# Patient Record
Sex: Female | Born: 1975 | Race: Black or African American | Hispanic: No | Marital: Married | Smoking: Never smoker
Health system: Southern US, Community
[De-identification: ages and names within clinical notes are randomized; demographics above are authoritative.]

## PROBLEM LIST (undated history)

## (undated) ENCOUNTER — Inpatient Hospital Stay (HOSPITAL_COMMUNITY): Payer: Self-pay

## (undated) DIAGNOSIS — R51 Headache: Secondary | ICD-10-CM

## (undated) DIAGNOSIS — O09519 Supervision of elderly primigravida, unspecified trimester: Secondary | ICD-10-CM

## (undated) DIAGNOSIS — Z8619 Personal history of other infectious and parasitic diseases: Secondary | ICD-10-CM

## (undated) DIAGNOSIS — F419 Anxiety disorder, unspecified: Secondary | ICD-10-CM

## (undated) DIAGNOSIS — R5383 Other fatigue: Secondary | ICD-10-CM

## (undated) DIAGNOSIS — F329 Major depressive disorder, single episode, unspecified: Secondary | ICD-10-CM

## (undated) DIAGNOSIS — F32A Depression, unspecified: Secondary | ICD-10-CM

## (undated) HISTORY — PX: LAPAROSCOPY: SHX197

## (undated) HISTORY — DX: Personal history of other infectious and parasitic diseases: Z86.19

## (undated) HISTORY — DX: Depression, unspecified: F32.A

## (undated) HISTORY — DX: Anxiety disorder, unspecified: F41.9

## (undated) HISTORY — DX: Other fatigue: R53.83

## (undated) HISTORY — DX: Headache: R51

## (undated) HISTORY — DX: Supervision of elderly primigravida, unspecified trimester: O09.519

## (undated) HISTORY — DX: Major depressive disorder, single episode, unspecified: F32.9

---

## 2005-12-12 ENCOUNTER — Other Ambulatory Visit: Admission: RE | Admit: 2005-12-12 | Discharge: 2005-12-12 | Payer: Self-pay | Admitting: Gynecology

## 2006-06-01 ENCOUNTER — Ambulatory Visit (HOSPITAL_COMMUNITY): Admission: RE | Admit: 2006-06-01 | Discharge: 2006-06-01 | Payer: Self-pay | Admitting: Neurology

## 2006-06-06 ENCOUNTER — Encounter: Admission: RE | Admit: 2006-06-06 | Discharge: 2006-06-06 | Payer: Self-pay | Admitting: Neurology

## 2006-11-01 ENCOUNTER — Other Ambulatory Visit: Admission: RE | Admit: 2006-11-01 | Discharge: 2006-11-01 | Payer: Self-pay | Admitting: Gynecology

## 2007-04-23 ENCOUNTER — Ambulatory Visit (HOSPITAL_COMMUNITY): Payer: Self-pay | Admitting: Psychiatry

## 2007-05-01 ENCOUNTER — Ambulatory Visit (HOSPITAL_COMMUNITY): Payer: Self-pay | Admitting: Licensed Clinical Social Worker

## 2007-05-08 ENCOUNTER — Ambulatory Visit (HOSPITAL_COMMUNITY): Payer: Self-pay | Admitting: Licensed Clinical Social Worker

## 2007-05-14 ENCOUNTER — Ambulatory Visit (HOSPITAL_COMMUNITY): Payer: Self-pay | Admitting: Licensed Clinical Social Worker

## 2007-05-23 ENCOUNTER — Ambulatory Visit (HOSPITAL_COMMUNITY): Payer: Self-pay | Admitting: Psychiatry

## 2007-05-23 ENCOUNTER — Ambulatory Visit (HOSPITAL_COMMUNITY): Payer: Self-pay | Admitting: Licensed Clinical Social Worker

## 2007-05-30 ENCOUNTER — Ambulatory Visit (HOSPITAL_COMMUNITY): Payer: Self-pay | Admitting: Licensed Clinical Social Worker

## 2007-06-07 ENCOUNTER — Ambulatory Visit (HOSPITAL_COMMUNITY): Payer: Self-pay | Admitting: Licensed Clinical Social Worker

## 2007-06-18 ENCOUNTER — Ambulatory Visit (HOSPITAL_COMMUNITY): Payer: Self-pay | Admitting: Licensed Clinical Social Worker

## 2007-06-22 ENCOUNTER — Ambulatory Visit (HOSPITAL_COMMUNITY): Payer: Self-pay | Admitting: Psychiatry

## 2007-07-12 ENCOUNTER — Ambulatory Visit (HOSPITAL_COMMUNITY): Payer: Self-pay | Admitting: Licensed Clinical Social Worker

## 2007-07-19 ENCOUNTER — Ambulatory Visit (HOSPITAL_COMMUNITY): Payer: Self-pay | Admitting: Licensed Clinical Social Worker

## 2007-07-25 ENCOUNTER — Ambulatory Visit (HOSPITAL_COMMUNITY): Payer: Self-pay | Admitting: Licensed Clinical Social Worker

## 2007-07-27 ENCOUNTER — Ambulatory Visit (HOSPITAL_COMMUNITY): Payer: Self-pay | Admitting: Psychiatry

## 2007-08-01 ENCOUNTER — Ambulatory Visit (HOSPITAL_COMMUNITY): Payer: Self-pay | Admitting: Licensed Clinical Social Worker

## 2007-08-08 ENCOUNTER — Ambulatory Visit (HOSPITAL_COMMUNITY): Payer: Self-pay | Admitting: Licensed Clinical Social Worker

## 2007-08-13 ENCOUNTER — Ambulatory Visit (HOSPITAL_COMMUNITY): Payer: Self-pay | Admitting: Licensed Clinical Social Worker

## 2007-08-22 ENCOUNTER — Ambulatory Visit (HOSPITAL_COMMUNITY): Payer: Self-pay | Admitting: Licensed Clinical Social Worker

## 2007-08-24 ENCOUNTER — Ambulatory Visit (HOSPITAL_COMMUNITY): Payer: Self-pay | Admitting: Psychiatry

## 2007-09-05 ENCOUNTER — Ambulatory Visit (HOSPITAL_COMMUNITY): Payer: Self-pay | Admitting: Licensed Clinical Social Worker

## 2007-09-12 ENCOUNTER — Ambulatory Visit (HOSPITAL_COMMUNITY): Payer: Self-pay | Admitting: Licensed Clinical Social Worker

## 2007-09-14 ENCOUNTER — Ambulatory Visit (HOSPITAL_COMMUNITY): Payer: Self-pay | Admitting: Psychiatry

## 2007-09-19 ENCOUNTER — Ambulatory Visit (HOSPITAL_COMMUNITY): Payer: Self-pay | Admitting: Licensed Clinical Social Worker

## 2007-09-25 IMAGING — RF DG FLUORO GUIDE NDL PLC/BX
1 series · 1 of 1 positions shown · non-contrast
Comparison: none

CLINICAL DATA: Abnormal MRI, headaches, rule out MS.  
 LUMBAR PUNCTURE ? USING FLUOROSCOPY:

[Series 1: run · 1 of 1 slices shown]
[im 1/1]
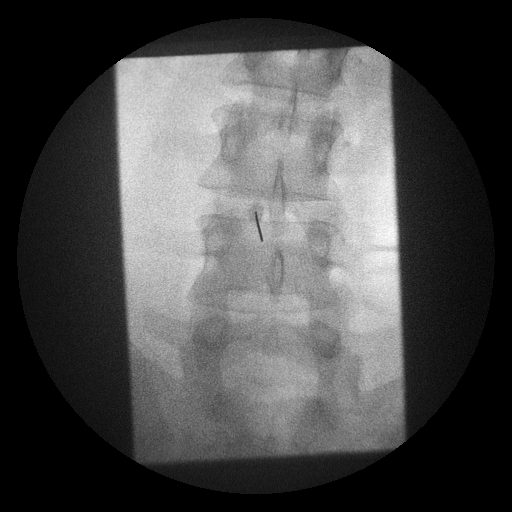

[1 of 1 positions shown; findings below may reference images not displayed]

FINDINGS: After obtaining informed written consent, the lower back was prepped with Betadine.  Then 2% lidocaine was used for local anesthesia.  Lumbar puncture was performed using fluoroscopic guidance at the L3-4 level on the left.  Opening pressure was 26 cm water.  CSF was clear.  Then 9 cc were obtained in four separate vials for laboratory studies.
IMPRESSION: Successful lumbar puncture.

## 2007-09-26 ENCOUNTER — Ambulatory Visit (HOSPITAL_COMMUNITY): Payer: Self-pay | Admitting: Licensed Clinical Social Worker

## 2007-09-26 ENCOUNTER — Ambulatory Visit (HOSPITAL_COMMUNITY): Payer: Self-pay | Admitting: Psychiatry

## 2007-10-03 ENCOUNTER — Ambulatory Visit (HOSPITAL_COMMUNITY): Payer: Self-pay | Admitting: Licensed Clinical Social Worker

## 2007-10-10 ENCOUNTER — Ambulatory Visit (HOSPITAL_COMMUNITY): Payer: Self-pay | Admitting: Licensed Clinical Social Worker

## 2007-10-18 ENCOUNTER — Ambulatory Visit (HOSPITAL_COMMUNITY): Payer: Self-pay | Admitting: Licensed Clinical Social Worker

## 2007-10-22 ENCOUNTER — Ambulatory Visit (HOSPITAL_COMMUNITY): Payer: Self-pay | Admitting: Psychiatry

## 2007-10-24 ENCOUNTER — Ambulatory Visit (HOSPITAL_COMMUNITY): Payer: Self-pay | Admitting: Licensed Clinical Social Worker

## 2007-10-31 ENCOUNTER — Ambulatory Visit (HOSPITAL_COMMUNITY): Payer: Self-pay | Admitting: Licensed Clinical Social Worker

## 2007-11-07 ENCOUNTER — Ambulatory Visit (HOSPITAL_COMMUNITY): Payer: Self-pay | Admitting: Licensed Clinical Social Worker

## 2007-11-12 ENCOUNTER — Other Ambulatory Visit: Admission: RE | Admit: 2007-11-12 | Discharge: 2007-11-12 | Payer: Self-pay | Admitting: Gynecology

## 2007-11-14 ENCOUNTER — Ambulatory Visit (HOSPITAL_COMMUNITY): Payer: Self-pay | Admitting: Licensed Clinical Social Worker

## 2007-11-16 ENCOUNTER — Ambulatory Visit (HOSPITAL_COMMUNITY): Payer: Self-pay | Admitting: Psychiatry

## 2007-11-21 ENCOUNTER — Ambulatory Visit (HOSPITAL_COMMUNITY): Payer: Self-pay | Admitting: Licensed Clinical Social Worker

## 2007-11-28 ENCOUNTER — Ambulatory Visit (HOSPITAL_COMMUNITY): Payer: Self-pay | Admitting: Licensed Clinical Social Worker

## 2007-12-05 ENCOUNTER — Ambulatory Visit (HOSPITAL_COMMUNITY): Payer: Self-pay | Admitting: Licensed Clinical Social Worker

## 2007-12-12 ENCOUNTER — Ambulatory Visit (HOSPITAL_COMMUNITY): Payer: Self-pay | Admitting: Licensed Clinical Social Worker

## 2008-01-04 ENCOUNTER — Ambulatory Visit (HOSPITAL_COMMUNITY): Payer: Self-pay | Admitting: Licensed Clinical Social Worker

## 2008-01-10 ENCOUNTER — Ambulatory Visit (HOSPITAL_COMMUNITY): Payer: Self-pay | Admitting: Licensed Clinical Social Worker

## 2008-01-16 ENCOUNTER — Ambulatory Visit (HOSPITAL_COMMUNITY): Payer: Self-pay | Admitting: Licensed Clinical Social Worker

## 2008-01-23 ENCOUNTER — Ambulatory Visit (HOSPITAL_COMMUNITY): Payer: Self-pay | Admitting: Licensed Clinical Social Worker

## 2008-01-30 ENCOUNTER — Ambulatory Visit (HOSPITAL_COMMUNITY): Payer: Self-pay | Admitting: Licensed Clinical Social Worker

## 2008-02-06 ENCOUNTER — Ambulatory Visit (HOSPITAL_COMMUNITY): Payer: Self-pay | Admitting: Licensed Clinical Social Worker

## 2008-02-14 ENCOUNTER — Ambulatory Visit (HOSPITAL_COMMUNITY): Payer: Self-pay | Admitting: Licensed Clinical Social Worker

## 2008-02-20 ENCOUNTER — Ambulatory Visit (HOSPITAL_COMMUNITY): Payer: Self-pay | Admitting: Licensed Clinical Social Worker

## 2008-02-27 ENCOUNTER — Ambulatory Visit (HOSPITAL_COMMUNITY): Payer: Self-pay | Admitting: Licensed Clinical Social Worker

## 2008-03-05 ENCOUNTER — Ambulatory Visit (HOSPITAL_COMMUNITY): Payer: Self-pay | Admitting: Licensed Clinical Social Worker

## 2008-03-12 ENCOUNTER — Ambulatory Visit (HOSPITAL_COMMUNITY): Payer: Self-pay | Admitting: Licensed Clinical Social Worker

## 2008-03-17 ENCOUNTER — Ambulatory Visit (HOSPITAL_COMMUNITY): Payer: Self-pay | Admitting: Psychiatry

## 2008-03-19 ENCOUNTER — Ambulatory Visit (HOSPITAL_COMMUNITY): Payer: Self-pay | Admitting: Licensed Clinical Social Worker

## 2008-03-26 ENCOUNTER — Ambulatory Visit (HOSPITAL_COMMUNITY): Payer: Self-pay | Admitting: Licensed Clinical Social Worker

## 2008-04-03 ENCOUNTER — Ambulatory Visit (HOSPITAL_COMMUNITY): Payer: Self-pay | Admitting: Licensed Clinical Social Worker

## 2008-04-09 ENCOUNTER — Ambulatory Visit (HOSPITAL_COMMUNITY): Payer: Self-pay | Admitting: Licensed Clinical Social Worker

## 2008-04-16 ENCOUNTER — Ambulatory Visit (HOSPITAL_COMMUNITY): Payer: Self-pay | Admitting: Licensed Clinical Social Worker

## 2008-04-23 ENCOUNTER — Ambulatory Visit (HOSPITAL_COMMUNITY): Payer: Self-pay | Admitting: Licensed Clinical Social Worker

## 2008-05-08 ENCOUNTER — Ambulatory Visit (HOSPITAL_COMMUNITY): Payer: Self-pay | Admitting: Licensed Clinical Social Worker

## 2008-05-12 ENCOUNTER — Ambulatory Visit (HOSPITAL_COMMUNITY): Payer: Self-pay | Admitting: Psychiatry

## 2008-05-14 ENCOUNTER — Ambulatory Visit (HOSPITAL_COMMUNITY): Payer: Self-pay | Admitting: Licensed Clinical Social Worker

## 2008-05-21 ENCOUNTER — Ambulatory Visit (HOSPITAL_COMMUNITY): Payer: Self-pay | Admitting: Licensed Clinical Social Worker

## 2008-05-28 ENCOUNTER — Ambulatory Visit (HOSPITAL_COMMUNITY): Payer: Self-pay | Admitting: Licensed Clinical Social Worker

## 2008-06-04 ENCOUNTER — Ambulatory Visit (HOSPITAL_COMMUNITY): Payer: Self-pay | Admitting: Licensed Clinical Social Worker

## 2008-06-11 ENCOUNTER — Ambulatory Visit (HOSPITAL_COMMUNITY): Payer: Self-pay | Admitting: Licensed Clinical Social Worker

## 2008-06-11 ENCOUNTER — Ambulatory Visit (HOSPITAL_COMMUNITY): Payer: Self-pay | Admitting: Psychiatry

## 2008-06-25 ENCOUNTER — Ambulatory Visit (HOSPITAL_COMMUNITY): Payer: Self-pay | Admitting: Licensed Clinical Social Worker

## 2008-06-25 ENCOUNTER — Ambulatory Visit (HOSPITAL_COMMUNITY): Payer: Self-pay | Admitting: Psychiatry

## 2008-07-11 ENCOUNTER — Ambulatory Visit (HOSPITAL_COMMUNITY): Payer: Self-pay | Admitting: Psychiatry

## 2008-07-16 ENCOUNTER — Ambulatory Visit (HOSPITAL_COMMUNITY): Payer: Self-pay | Admitting: Licensed Clinical Social Worker

## 2008-07-23 ENCOUNTER — Ambulatory Visit (HOSPITAL_COMMUNITY): Payer: Self-pay | Admitting: Licensed Clinical Social Worker

## 2008-07-30 ENCOUNTER — Ambulatory Visit (HOSPITAL_COMMUNITY): Payer: Self-pay | Admitting: Licensed Clinical Social Worker

## 2008-08-06 ENCOUNTER — Ambulatory Visit (HOSPITAL_COMMUNITY): Payer: Self-pay | Admitting: Psychiatry

## 2008-08-06 ENCOUNTER — Ambulatory Visit (HOSPITAL_COMMUNITY): Payer: Self-pay | Admitting: Licensed Clinical Social Worker

## 2008-08-13 ENCOUNTER — Ambulatory Visit (HOSPITAL_COMMUNITY): Payer: Self-pay | Admitting: Licensed Clinical Social Worker

## 2008-08-20 ENCOUNTER — Ambulatory Visit (HOSPITAL_COMMUNITY): Payer: Self-pay | Admitting: Licensed Clinical Social Worker

## 2008-08-27 ENCOUNTER — Ambulatory Visit (HOSPITAL_COMMUNITY): Payer: Self-pay | Admitting: Psychiatry

## 2008-08-27 ENCOUNTER — Ambulatory Visit (HOSPITAL_COMMUNITY): Payer: Self-pay | Admitting: Licensed Clinical Social Worker

## 2008-09-03 ENCOUNTER — Ambulatory Visit (HOSPITAL_COMMUNITY): Payer: Self-pay | Admitting: Licensed Clinical Social Worker

## 2008-09-10 ENCOUNTER — Ambulatory Visit (HOSPITAL_COMMUNITY): Payer: Self-pay | Admitting: Licensed Clinical Social Worker

## 2008-09-17 ENCOUNTER — Ambulatory Visit (HOSPITAL_COMMUNITY): Payer: Self-pay | Admitting: Licensed Clinical Social Worker

## 2008-09-24 ENCOUNTER — Ambulatory Visit (HOSPITAL_COMMUNITY): Payer: Self-pay | Admitting: Licensed Clinical Social Worker

## 2008-09-24 ENCOUNTER — Ambulatory Visit (HOSPITAL_COMMUNITY): Payer: Self-pay | Admitting: Psychiatry

## 2008-10-01 ENCOUNTER — Ambulatory Visit (HOSPITAL_COMMUNITY): Payer: Self-pay | Admitting: Licensed Clinical Social Worker

## 2008-10-08 ENCOUNTER — Ambulatory Visit (HOSPITAL_COMMUNITY): Payer: Self-pay | Admitting: Licensed Clinical Social Worker

## 2008-10-15 ENCOUNTER — Ambulatory Visit (HOSPITAL_COMMUNITY): Payer: Self-pay | Admitting: Licensed Clinical Social Worker

## 2008-10-22 ENCOUNTER — Ambulatory Visit (HOSPITAL_COMMUNITY): Payer: Self-pay | Admitting: Licensed Clinical Social Worker

## 2008-10-29 ENCOUNTER — Ambulatory Visit (HOSPITAL_COMMUNITY): Payer: Self-pay | Admitting: Licensed Clinical Social Worker

## 2008-11-04 ENCOUNTER — Other Ambulatory Visit: Admission: RE | Admit: 2008-11-04 | Discharge: 2008-11-04 | Payer: Self-pay | Admitting: Gynecology

## 2008-11-05 ENCOUNTER — Ambulatory Visit (HOSPITAL_COMMUNITY): Payer: Self-pay | Admitting: Licensed Clinical Social Worker

## 2008-11-12 ENCOUNTER — Ambulatory Visit (HOSPITAL_COMMUNITY): Payer: Self-pay | Admitting: Licensed Clinical Social Worker

## 2008-11-12 ENCOUNTER — Ambulatory Visit (HOSPITAL_COMMUNITY): Payer: Self-pay | Admitting: Psychiatry

## 2008-11-19 ENCOUNTER — Ambulatory Visit (HOSPITAL_COMMUNITY): Payer: Self-pay | Admitting: Licensed Clinical Social Worker

## 2008-11-26 ENCOUNTER — Ambulatory Visit (HOSPITAL_COMMUNITY): Payer: Self-pay | Admitting: Licensed Clinical Social Worker

## 2008-12-03 ENCOUNTER — Ambulatory Visit (HOSPITAL_COMMUNITY): Payer: Self-pay | Admitting: Psychiatry

## 2008-12-10 ENCOUNTER — Ambulatory Visit (HOSPITAL_COMMUNITY): Payer: Self-pay | Admitting: Licensed Clinical Social Worker

## 2008-12-17 ENCOUNTER — Ambulatory Visit (HOSPITAL_COMMUNITY): Payer: Self-pay | Admitting: Licensed Clinical Social Worker

## 2008-12-24 ENCOUNTER — Ambulatory Visit (HOSPITAL_COMMUNITY): Payer: Self-pay | Admitting: Licensed Clinical Social Worker

## 2008-12-31 ENCOUNTER — Ambulatory Visit (HOSPITAL_COMMUNITY): Payer: Self-pay | Admitting: Licensed Clinical Social Worker

## 2008-12-31 ENCOUNTER — Ambulatory Visit (HOSPITAL_COMMUNITY): Payer: Self-pay | Admitting: Psychiatry

## 2009-01-07 ENCOUNTER — Ambulatory Visit (HOSPITAL_COMMUNITY): Payer: Self-pay | Admitting: Licensed Clinical Social Worker

## 2009-01-14 ENCOUNTER — Ambulatory Visit (HOSPITAL_COMMUNITY): Payer: Self-pay | Admitting: Licensed Clinical Social Worker

## 2009-01-19 ENCOUNTER — Ambulatory Visit (HOSPITAL_COMMUNITY): Payer: Self-pay | Admitting: Licensed Clinical Social Worker

## 2009-01-28 ENCOUNTER — Ambulatory Visit (HOSPITAL_COMMUNITY): Payer: Self-pay | Admitting: Licensed Clinical Social Worker

## 2009-02-02 ENCOUNTER — Ambulatory Visit (HOSPITAL_COMMUNITY): Payer: Self-pay | Admitting: Licensed Clinical Social Worker

## 2009-02-09 ENCOUNTER — Ambulatory Visit (HOSPITAL_COMMUNITY): Payer: Self-pay | Admitting: Licensed Clinical Social Worker

## 2009-02-25 ENCOUNTER — Ambulatory Visit (HOSPITAL_COMMUNITY): Payer: Self-pay | Admitting: Licensed Clinical Social Worker

## 2009-03-02 ENCOUNTER — Ambulatory Visit (HOSPITAL_COMMUNITY): Payer: Self-pay | Admitting: Licensed Clinical Social Worker

## 2009-03-09 ENCOUNTER — Ambulatory Visit (HOSPITAL_COMMUNITY): Payer: Self-pay | Admitting: Licensed Clinical Social Worker

## 2009-03-16 ENCOUNTER — Ambulatory Visit (HOSPITAL_COMMUNITY): Payer: Self-pay | Admitting: Licensed Clinical Social Worker

## 2009-03-23 ENCOUNTER — Ambulatory Visit (HOSPITAL_COMMUNITY): Payer: Self-pay | Admitting: Licensed Clinical Social Worker

## 2009-03-23 ENCOUNTER — Ambulatory Visit (HOSPITAL_COMMUNITY): Payer: Self-pay | Admitting: Psychiatry

## 2009-04-06 ENCOUNTER — Ambulatory Visit (HOSPITAL_COMMUNITY): Payer: Self-pay | Admitting: Licensed Clinical Social Worker

## 2009-04-13 ENCOUNTER — Ambulatory Visit (HOSPITAL_COMMUNITY): Payer: Self-pay | Admitting: Licensed Clinical Social Worker

## 2009-04-20 ENCOUNTER — Ambulatory Visit (HOSPITAL_COMMUNITY): Payer: Self-pay | Admitting: Licensed Clinical Social Worker

## 2009-05-04 ENCOUNTER — Ambulatory Visit (HOSPITAL_COMMUNITY): Payer: Self-pay | Admitting: Licensed Clinical Social Worker

## 2009-05-11 ENCOUNTER — Ambulatory Visit (HOSPITAL_COMMUNITY): Payer: Self-pay | Admitting: Licensed Clinical Social Worker

## 2009-05-18 ENCOUNTER — Ambulatory Visit (HOSPITAL_COMMUNITY): Payer: Self-pay | Admitting: Psychiatry

## 2009-05-25 ENCOUNTER — Ambulatory Visit (HOSPITAL_COMMUNITY): Payer: Self-pay | Admitting: Licensed Clinical Social Worker

## 2009-06-08 ENCOUNTER — Ambulatory Visit (HOSPITAL_COMMUNITY): Payer: Self-pay | Admitting: Licensed Clinical Social Worker

## 2009-06-15 ENCOUNTER — Ambulatory Visit (HOSPITAL_COMMUNITY): Payer: Self-pay | Admitting: Licensed Clinical Social Worker

## 2009-06-22 ENCOUNTER — Ambulatory Visit (HOSPITAL_COMMUNITY): Payer: Self-pay | Admitting: Licensed Clinical Social Worker

## 2009-06-29 ENCOUNTER — Ambulatory Visit (HOSPITAL_COMMUNITY): Payer: Self-pay | Admitting: Licensed Clinical Social Worker

## 2009-07-13 ENCOUNTER — Ambulatory Visit (HOSPITAL_COMMUNITY): Payer: Self-pay | Admitting: Licensed Clinical Social Worker

## 2009-07-20 ENCOUNTER — Ambulatory Visit (HOSPITAL_COMMUNITY): Payer: Self-pay | Admitting: Psychiatry

## 2009-07-20 ENCOUNTER — Ambulatory Visit (HOSPITAL_COMMUNITY): Payer: Self-pay | Admitting: Licensed Clinical Social Worker

## 2009-07-27 ENCOUNTER — Ambulatory Visit (HOSPITAL_COMMUNITY): Payer: Self-pay | Admitting: Licensed Clinical Social Worker

## 2009-08-03 ENCOUNTER — Ambulatory Visit (HOSPITAL_COMMUNITY): Payer: Self-pay | Admitting: Licensed Clinical Social Worker

## 2009-08-10 ENCOUNTER — Ambulatory Visit (HOSPITAL_COMMUNITY): Payer: Self-pay | Admitting: Licensed Clinical Social Worker

## 2009-08-17 ENCOUNTER — Ambulatory Visit (HOSPITAL_COMMUNITY): Payer: Self-pay | Admitting: Licensed Clinical Social Worker

## 2009-08-26 ENCOUNTER — Ambulatory Visit (HOSPITAL_COMMUNITY): Payer: Self-pay | Admitting: Licensed Clinical Social Worker

## 2009-09-08 ENCOUNTER — Ambulatory Visit (HOSPITAL_COMMUNITY): Payer: Self-pay | Admitting: Licensed Clinical Social Worker

## 2009-09-14 ENCOUNTER — Ambulatory Visit (HOSPITAL_COMMUNITY): Payer: Self-pay | Admitting: Licensed Clinical Social Worker

## 2009-09-21 ENCOUNTER — Ambulatory Visit (HOSPITAL_COMMUNITY): Payer: Self-pay | Admitting: Licensed Clinical Social Worker

## 2009-09-29 ENCOUNTER — Ambulatory Visit (HOSPITAL_COMMUNITY): Payer: Self-pay | Admitting: Licensed Clinical Social Worker

## 2009-10-05 ENCOUNTER — Ambulatory Visit (HOSPITAL_COMMUNITY): Payer: Self-pay | Admitting: Licensed Clinical Social Worker

## 2009-10-12 ENCOUNTER — Ambulatory Visit (HOSPITAL_COMMUNITY): Payer: Self-pay | Admitting: Licensed Clinical Social Worker

## 2009-10-19 ENCOUNTER — Ambulatory Visit (HOSPITAL_COMMUNITY): Payer: Self-pay | Admitting: Psychiatry

## 2009-10-19 ENCOUNTER — Ambulatory Visit (HOSPITAL_COMMUNITY): Payer: Self-pay | Admitting: Licensed Clinical Social Worker

## 2009-11-02 ENCOUNTER — Ambulatory Visit (HOSPITAL_COMMUNITY): Payer: Self-pay | Admitting: Licensed Clinical Social Worker

## 2009-11-09 ENCOUNTER — Ambulatory Visit (HOSPITAL_COMMUNITY): Payer: Self-pay | Admitting: Licensed Clinical Social Worker

## 2009-11-18 ENCOUNTER — Ambulatory Visit (HOSPITAL_COMMUNITY): Payer: Self-pay | Admitting: Licensed Clinical Social Worker

## 2009-11-23 ENCOUNTER — Ambulatory Visit (HOSPITAL_COMMUNITY): Payer: Self-pay | Admitting: Licensed Clinical Social Worker

## 2009-11-30 ENCOUNTER — Ambulatory Visit (HOSPITAL_COMMUNITY): Payer: Self-pay | Admitting: Licensed Clinical Social Worker

## 2009-11-30 ENCOUNTER — Ambulatory Visit (HOSPITAL_COMMUNITY): Payer: Self-pay | Admitting: Psychiatry

## 2009-12-07 ENCOUNTER — Ambulatory Visit (HOSPITAL_COMMUNITY): Payer: Self-pay | Admitting: Licensed Clinical Social Worker

## 2009-12-21 ENCOUNTER — Ambulatory Visit (HOSPITAL_COMMUNITY): Payer: Self-pay | Admitting: Licensed Clinical Social Worker

## 2009-12-25 ENCOUNTER — Ambulatory Visit (HOSPITAL_COMMUNITY): Payer: Self-pay | Admitting: Psychiatry

## 2009-12-28 ENCOUNTER — Ambulatory Visit (HOSPITAL_COMMUNITY): Payer: Self-pay | Admitting: Licensed Clinical Social Worker

## 2010-01-04 ENCOUNTER — Ambulatory Visit (HOSPITAL_COMMUNITY): Payer: Self-pay | Admitting: Licensed Clinical Social Worker

## 2010-01-06 ENCOUNTER — Ambulatory Visit (HOSPITAL_COMMUNITY): Payer: Self-pay | Admitting: Psychiatry

## 2010-01-18 ENCOUNTER — Ambulatory Visit (HOSPITAL_COMMUNITY): Payer: Self-pay | Admitting: Licensed Clinical Social Worker

## 2010-01-25 ENCOUNTER — Ambulatory Visit (HOSPITAL_COMMUNITY): Payer: Self-pay | Admitting: Licensed Clinical Social Worker

## 2010-02-01 ENCOUNTER — Ambulatory Visit (HOSPITAL_COMMUNITY): Payer: Self-pay | Admitting: Licensed Clinical Social Worker

## 2010-02-03 ENCOUNTER — Ambulatory Visit (HOSPITAL_COMMUNITY): Payer: Self-pay | Admitting: Psychiatry

## 2010-02-08 ENCOUNTER — Ambulatory Visit (HOSPITAL_COMMUNITY): Payer: Self-pay | Admitting: Licensed Clinical Social Worker

## 2010-02-22 ENCOUNTER — Ambulatory Visit (HOSPITAL_COMMUNITY): Payer: Self-pay | Admitting: Licensed Clinical Social Worker

## 2010-03-10 ENCOUNTER — Ambulatory Visit (HOSPITAL_COMMUNITY): Payer: Self-pay | Admitting: Licensed Clinical Social Worker

## 2010-03-15 ENCOUNTER — Ambulatory Visit (HOSPITAL_COMMUNITY): Payer: Self-pay | Admitting: Licensed Clinical Social Worker

## 2010-03-22 ENCOUNTER — Ambulatory Visit (HOSPITAL_COMMUNITY): Payer: Self-pay | Admitting: Licensed Clinical Social Worker

## 2010-03-31 ENCOUNTER — Ambulatory Visit (HOSPITAL_COMMUNITY): Payer: Self-pay | Admitting: Licensed Clinical Social Worker

## 2010-04-05 ENCOUNTER — Ambulatory Visit (HOSPITAL_COMMUNITY): Payer: Self-pay | Admitting: Psychiatry

## 2010-04-05 ENCOUNTER — Ambulatory Visit (HOSPITAL_COMMUNITY): Payer: Self-pay | Admitting: Licensed Clinical Social Worker

## 2010-04-12 ENCOUNTER — Ambulatory Visit (HOSPITAL_COMMUNITY): Payer: Self-pay | Admitting: Licensed Clinical Social Worker

## 2010-04-19 ENCOUNTER — Ambulatory Visit (HOSPITAL_COMMUNITY): Payer: Self-pay | Admitting: Psychiatry

## 2010-04-19 ENCOUNTER — Ambulatory Visit (HOSPITAL_COMMUNITY): Payer: Self-pay | Admitting: Licensed Clinical Social Worker

## 2010-04-28 ENCOUNTER — Ambulatory Visit (HOSPITAL_COMMUNITY): Payer: Self-pay | Admitting: Psychiatry

## 2010-05-04 ENCOUNTER — Ambulatory Visit (HOSPITAL_COMMUNITY): Payer: Self-pay | Admitting: Licensed Clinical Social Worker

## 2010-05-11 ENCOUNTER — Ambulatory Visit (HOSPITAL_COMMUNITY): Payer: Self-pay | Admitting: Licensed Clinical Social Worker

## 2010-05-24 ENCOUNTER — Ambulatory Visit (HOSPITAL_COMMUNITY): Payer: Self-pay | Admitting: Licensed Clinical Social Worker

## 2010-05-24 ENCOUNTER — Ambulatory Visit (HOSPITAL_COMMUNITY): Payer: Self-pay | Admitting: Psychiatry

## 2010-05-31 ENCOUNTER — Ambulatory Visit (HOSPITAL_COMMUNITY): Payer: Self-pay | Admitting: Licensed Clinical Social Worker

## 2010-06-07 ENCOUNTER — Ambulatory Visit (HOSPITAL_COMMUNITY): Payer: Self-pay | Admitting: Licensed Clinical Social Worker

## 2010-06-21 ENCOUNTER — Ambulatory Visit (HOSPITAL_COMMUNITY): Payer: Self-pay | Admitting: Licensed Clinical Social Worker

## 2010-06-28 ENCOUNTER — Ambulatory Visit (HOSPITAL_COMMUNITY): Payer: Self-pay | Admitting: Psychiatry

## 2010-06-28 ENCOUNTER — Ambulatory Visit (HOSPITAL_COMMUNITY): Payer: Self-pay | Admitting: Licensed Clinical Social Worker

## 2010-07-06 ENCOUNTER — Ambulatory Visit (HOSPITAL_COMMUNITY): Payer: Self-pay | Admitting: Licensed Clinical Social Worker

## 2010-07-12 ENCOUNTER — Ambulatory Visit (HOSPITAL_COMMUNITY): Payer: Self-pay | Admitting: Licensed Clinical Social Worker

## 2010-07-14 ENCOUNTER — Ambulatory Visit (HOSPITAL_COMMUNITY): Payer: Self-pay | Admitting: Psychiatry

## 2010-07-19 ENCOUNTER — Ambulatory Visit (HOSPITAL_COMMUNITY): Payer: Self-pay | Admitting: Licensed Clinical Social Worker

## 2010-08-02 ENCOUNTER — Ambulatory Visit (HOSPITAL_COMMUNITY): Payer: Self-pay | Admitting: Licensed Clinical Social Worker

## 2010-08-04 ENCOUNTER — Ambulatory Visit (HOSPITAL_COMMUNITY): Payer: Self-pay | Admitting: Psychiatry

## 2010-08-09 ENCOUNTER — Ambulatory Visit (HOSPITAL_COMMUNITY): Payer: Self-pay | Admitting: Licensed Clinical Social Worker

## 2010-08-23 ENCOUNTER — Ambulatory Visit (HOSPITAL_COMMUNITY): Payer: Self-pay | Admitting: Licensed Clinical Social Worker

## 2010-08-30 ENCOUNTER — Ambulatory Visit (HOSPITAL_COMMUNITY): Payer: Self-pay | Admitting: Licensed Clinical Social Worker

## 2010-09-07 ENCOUNTER — Ambulatory Visit (HOSPITAL_COMMUNITY): Payer: Self-pay | Admitting: Licensed Clinical Social Worker

## 2010-09-13 ENCOUNTER — Ambulatory Visit (HOSPITAL_COMMUNITY): Payer: Self-pay | Admitting: Licensed Clinical Social Worker

## 2010-09-20 ENCOUNTER — Ambulatory Visit (HOSPITAL_COMMUNITY): Payer: Self-pay | Admitting: Licensed Clinical Social Worker

## 2010-10-04 ENCOUNTER — Ambulatory Visit (HOSPITAL_COMMUNITY): Payer: Self-pay | Admitting: Psychiatry

## 2010-10-05 ENCOUNTER — Ambulatory Visit (HOSPITAL_COMMUNITY): Payer: Self-pay | Admitting: Licensed Clinical Social Worker

## 2010-10-11 ENCOUNTER — Ambulatory Visit (HOSPITAL_COMMUNITY): Payer: Self-pay | Admitting: Licensed Clinical Social Worker

## 2010-10-18 ENCOUNTER — Ambulatory Visit (HOSPITAL_COMMUNITY): Payer: Self-pay | Admitting: Licensed Clinical Social Worker

## 2010-10-26 ENCOUNTER — Ambulatory Visit (HOSPITAL_COMMUNITY): Payer: Self-pay | Admitting: Licensed Clinical Social Worker

## 2010-11-02 ENCOUNTER — Ambulatory Visit (HOSPITAL_COMMUNITY)
Admission: RE | Admit: 2010-11-02 | Discharge: 2010-11-02 | Payer: Self-pay | Source: Home / Self Care | Attending: Licensed Clinical Social Worker | Admitting: Licensed Clinical Social Worker

## 2010-11-08 ENCOUNTER — Ambulatory Visit (HOSPITAL_COMMUNITY)
Admission: RE | Admit: 2010-11-08 | Discharge: 2010-11-08 | Payer: Self-pay | Source: Home / Self Care | Attending: Licensed Clinical Social Worker | Admitting: Licensed Clinical Social Worker

## 2010-11-12 ENCOUNTER — Ambulatory Visit (HOSPITAL_COMMUNITY)
Admission: RE | Admit: 2010-11-12 | Discharge: 2010-11-12 | Payer: Self-pay | Source: Home / Self Care | Attending: Psychiatry | Admitting: Psychiatry

## 2010-11-15 ENCOUNTER — Ambulatory Visit (HOSPITAL_COMMUNITY)
Admission: RE | Admit: 2010-11-15 | Discharge: 2010-11-15 | Payer: Self-pay | Source: Home / Self Care | Attending: Licensed Clinical Social Worker | Admitting: Licensed Clinical Social Worker

## 2010-11-22 ENCOUNTER — Ambulatory Visit (HOSPITAL_COMMUNITY)
Admission: RE | Admit: 2010-11-22 | Discharge: 2010-11-22 | Payer: Self-pay | Source: Home / Self Care | Attending: Licensed Clinical Social Worker | Admitting: Licensed Clinical Social Worker

## 2010-11-29 ENCOUNTER — Ambulatory Visit (HOSPITAL_COMMUNITY)
Admission: RE | Admit: 2010-11-29 | Discharge: 2010-11-29 | Payer: Self-pay | Source: Home / Self Care | Attending: Licensed Clinical Social Worker | Admitting: Licensed Clinical Social Worker

## 2010-12-06 ENCOUNTER — Encounter (HOSPITAL_COMMUNITY): Payer: 59 | Admitting: Licensed Clinical Social Worker

## 2010-12-06 DIAGNOSIS — F332 Major depressive disorder, recurrent severe without psychotic features: Secondary | ICD-10-CM

## 2010-12-13 ENCOUNTER — Encounter (HOSPITAL_COMMUNITY): Payer: 59 | Admitting: Licensed Clinical Social Worker

## 2010-12-13 DIAGNOSIS — F332 Major depressive disorder, recurrent severe without psychotic features: Secondary | ICD-10-CM

## 2010-12-20 ENCOUNTER — Encounter (HOSPITAL_COMMUNITY): Payer: Self-pay | Admitting: Licensed Clinical Social Worker

## 2010-12-22 ENCOUNTER — Encounter (HOSPITAL_COMMUNITY): Payer: Self-pay | Admitting: Licensed Clinical Social Worker

## 2010-12-27 ENCOUNTER — Encounter (HOSPITAL_COMMUNITY): Payer: 59 | Admitting: Psychiatry

## 2010-12-27 DIAGNOSIS — F331 Major depressive disorder, recurrent, moderate: Secondary | ICD-10-CM

## 2011-01-10 ENCOUNTER — Encounter (HOSPITAL_COMMUNITY): Payer: 59 | Admitting: Licensed Clinical Social Worker

## 2011-01-10 DIAGNOSIS — F332 Major depressive disorder, recurrent severe without psychotic features: Secondary | ICD-10-CM

## 2011-01-17 ENCOUNTER — Encounter (HOSPITAL_COMMUNITY): Payer: 59 | Admitting: Licensed Clinical Social Worker

## 2011-01-19 ENCOUNTER — Encounter (HOSPITAL_COMMUNITY): Payer: 59 | Admitting: Psychiatry

## 2011-01-24 ENCOUNTER — Encounter (HOSPITAL_COMMUNITY): Payer: 59 | Admitting: Licensed Clinical Social Worker

## 2011-01-24 DIAGNOSIS — F332 Major depressive disorder, recurrent severe without psychotic features: Secondary | ICD-10-CM

## 2011-01-31 ENCOUNTER — Encounter (HOSPITAL_COMMUNITY): Payer: 59 | Admitting: Licensed Clinical Social Worker

## 2011-01-31 DIAGNOSIS — F332 Major depressive disorder, recurrent severe without psychotic features: Secondary | ICD-10-CM

## 2011-02-02 ENCOUNTER — Encounter (HOSPITAL_COMMUNITY): Payer: 59 | Admitting: Psychiatry

## 2011-02-02 DIAGNOSIS — F331 Major depressive disorder, recurrent, moderate: Secondary | ICD-10-CM

## 2011-02-07 ENCOUNTER — Encounter (HOSPITAL_COMMUNITY): Payer: 59 | Admitting: Licensed Clinical Social Worker

## 2011-02-14 ENCOUNTER — Encounter (HOSPITAL_COMMUNITY): Payer: 59 | Admitting: Licensed Clinical Social Worker

## 2011-02-14 DIAGNOSIS — F332 Major depressive disorder, recurrent severe without psychotic features: Secondary | ICD-10-CM

## 2011-02-21 ENCOUNTER — Encounter (HOSPITAL_COMMUNITY): Payer: 59 | Admitting: Licensed Clinical Social Worker

## 2011-02-21 DIAGNOSIS — F332 Major depressive disorder, recurrent severe without psychotic features: Secondary | ICD-10-CM

## 2011-02-28 ENCOUNTER — Encounter (HOSPITAL_COMMUNITY): Payer: 59 | Admitting: Licensed Clinical Social Worker

## 2011-02-28 DIAGNOSIS — F332 Major depressive disorder, recurrent severe without psychotic features: Secondary | ICD-10-CM

## 2011-03-07 ENCOUNTER — Encounter (HOSPITAL_COMMUNITY): Payer: 59 | Admitting: Licensed Clinical Social Worker

## 2011-03-07 DIAGNOSIS — F332 Major depressive disorder, recurrent severe without psychotic features: Secondary | ICD-10-CM

## 2011-03-14 ENCOUNTER — Encounter (HOSPITAL_COMMUNITY): Payer: 59 | Admitting: Licensed Clinical Social Worker

## 2011-03-14 ENCOUNTER — Encounter (HOSPITAL_COMMUNITY): Payer: 59 | Admitting: Psychiatry

## 2011-03-14 DIAGNOSIS — F332 Major depressive disorder, recurrent severe without psychotic features: Secondary | ICD-10-CM

## 2011-03-14 DIAGNOSIS — F331 Major depressive disorder, recurrent, moderate: Secondary | ICD-10-CM

## 2011-03-21 ENCOUNTER — Encounter (HOSPITAL_COMMUNITY): Payer: 59 | Admitting: Licensed Clinical Social Worker

## 2011-03-21 DIAGNOSIS — F332 Major depressive disorder, recurrent severe without psychotic features: Secondary | ICD-10-CM

## 2011-03-29 ENCOUNTER — Encounter (HOSPITAL_COMMUNITY): Payer: 59 | Admitting: Licensed Clinical Social Worker

## 2011-03-29 DIAGNOSIS — F332 Major depressive disorder, recurrent severe without psychotic features: Secondary | ICD-10-CM

## 2011-04-04 ENCOUNTER — Encounter (HOSPITAL_COMMUNITY): Payer: 59 | Admitting: Licensed Clinical Social Worker

## 2011-04-04 DIAGNOSIS — F332 Major depressive disorder, recurrent severe without psychotic features: Secondary | ICD-10-CM

## 2011-04-06 ENCOUNTER — Encounter (HOSPITAL_COMMUNITY): Payer: 59 | Admitting: Psychiatry

## 2011-04-11 ENCOUNTER — Encounter (HOSPITAL_COMMUNITY): Payer: 59 | Admitting: Psychiatry

## 2011-04-11 DIAGNOSIS — F331 Major depressive disorder, recurrent, moderate: Secondary | ICD-10-CM

## 2011-04-18 ENCOUNTER — Encounter (HOSPITAL_COMMUNITY): Payer: 59 | Admitting: Licensed Clinical Social Worker

## 2011-04-18 DIAGNOSIS — F332 Major depressive disorder, recurrent severe without psychotic features: Secondary | ICD-10-CM

## 2011-04-25 ENCOUNTER — Encounter (HOSPITAL_COMMUNITY): Payer: 59 | Admitting: Licensed Clinical Social Worker

## 2011-04-25 DIAGNOSIS — F332 Major depressive disorder, recurrent severe without psychotic features: Secondary | ICD-10-CM

## 2011-05-09 ENCOUNTER — Encounter (HOSPITAL_COMMUNITY): Payer: 59 | Admitting: Licensed Clinical Social Worker

## 2011-05-09 DIAGNOSIS — F332 Major depressive disorder, recurrent severe without psychotic features: Secondary | ICD-10-CM

## 2011-05-16 ENCOUNTER — Encounter (HOSPITAL_COMMUNITY): Payer: 59 | Admitting: Psychiatry

## 2011-05-16 DIAGNOSIS — F331 Major depressive disorder, recurrent, moderate: Secondary | ICD-10-CM

## 2011-05-23 ENCOUNTER — Encounter (HOSPITAL_COMMUNITY): Payer: 59 | Admitting: Licensed Clinical Social Worker

## 2011-05-30 ENCOUNTER — Encounter (HOSPITAL_COMMUNITY): Payer: 59 | Admitting: Licensed Clinical Social Worker

## 2011-05-30 DIAGNOSIS — F332 Major depressive disorder, recurrent severe without psychotic features: Secondary | ICD-10-CM

## 2011-06-06 ENCOUNTER — Encounter (HOSPITAL_COMMUNITY): Payer: 59 | Admitting: Licensed Clinical Social Worker

## 2011-06-06 ENCOUNTER — Encounter (HOSPITAL_COMMUNITY): Payer: 59 | Admitting: Psychiatry

## 2011-06-06 DIAGNOSIS — F331 Major depressive disorder, recurrent, moderate: Secondary | ICD-10-CM

## 2011-06-06 DIAGNOSIS — F332 Major depressive disorder, recurrent severe without psychotic features: Secondary | ICD-10-CM

## 2011-06-13 ENCOUNTER — Encounter (HOSPITAL_COMMUNITY): Payer: 59 | Admitting: Licensed Clinical Social Worker

## 2011-06-13 DIAGNOSIS — F332 Major depressive disorder, recurrent severe without psychotic features: Secondary | ICD-10-CM

## 2011-06-20 ENCOUNTER — Encounter (HOSPITAL_COMMUNITY): Payer: 59 | Admitting: Licensed Clinical Social Worker

## 2011-06-20 DIAGNOSIS — F332 Major depressive disorder, recurrent severe without psychotic features: Secondary | ICD-10-CM

## 2011-06-27 ENCOUNTER — Encounter (HOSPITAL_COMMUNITY): Payer: 59 | Admitting: Licensed Clinical Social Worker

## 2011-06-27 DIAGNOSIS — F332 Major depressive disorder, recurrent severe without psychotic features: Secondary | ICD-10-CM

## 2011-07-05 ENCOUNTER — Encounter (HOSPITAL_COMMUNITY): Payer: 59 | Admitting: Licensed Clinical Social Worker

## 2011-07-05 DIAGNOSIS — F332 Major depressive disorder, recurrent severe without psychotic features: Secondary | ICD-10-CM

## 2011-07-06 ENCOUNTER — Encounter (HOSPITAL_COMMUNITY): Payer: 59 | Admitting: Psychiatry

## 2011-07-18 ENCOUNTER — Encounter (HOSPITAL_COMMUNITY): Payer: 59 | Admitting: Psychiatry

## 2011-07-18 DIAGNOSIS — F331 Major depressive disorder, recurrent, moderate: Secondary | ICD-10-CM

## 2011-08-01 ENCOUNTER — Encounter (HOSPITAL_COMMUNITY): Payer: 59 | Admitting: Licensed Clinical Social Worker

## 2011-08-01 DIAGNOSIS — F332 Major depressive disorder, recurrent severe without psychotic features: Secondary | ICD-10-CM

## 2011-08-08 ENCOUNTER — Encounter (HOSPITAL_COMMUNITY): Payer: 59 | Admitting: Licensed Clinical Social Worker

## 2011-08-08 DIAGNOSIS — F332 Major depressive disorder, recurrent severe without psychotic features: Secondary | ICD-10-CM

## 2011-08-15 ENCOUNTER — Encounter (HOSPITAL_COMMUNITY): Payer: 59 | Admitting: Licensed Clinical Social Worker

## 2011-08-15 DIAGNOSIS — F332 Major depressive disorder, recurrent severe without psychotic features: Secondary | ICD-10-CM

## 2011-08-22 ENCOUNTER — Encounter (HOSPITAL_COMMUNITY): Payer: 59 | Admitting: Licensed Clinical Social Worker

## 2011-08-22 DIAGNOSIS — F332 Major depressive disorder, recurrent severe without psychotic features: Secondary | ICD-10-CM

## 2011-08-30 ENCOUNTER — Encounter (HOSPITAL_COMMUNITY): Payer: 59 | Admitting: Licensed Clinical Social Worker

## 2011-08-30 DIAGNOSIS — F332 Major depressive disorder, recurrent severe without psychotic features: Secondary | ICD-10-CM

## 2011-09-12 ENCOUNTER — Ambulatory Visit (HOSPITAL_COMMUNITY): Payer: 59 | Admitting: Psychiatry

## 2011-09-12 ENCOUNTER — Ambulatory Visit (INDEPENDENT_AMBULATORY_CARE_PROVIDER_SITE_OTHER): Payer: 59 | Admitting: Licensed Clinical Social Worker

## 2011-09-12 ENCOUNTER — Encounter (HOSPITAL_COMMUNITY): Payer: Self-pay | Admitting: Licensed Clinical Social Worker

## 2011-09-12 ENCOUNTER — Encounter (HOSPITAL_COMMUNITY): Payer: Self-pay | Admitting: Psychiatry

## 2011-09-12 DIAGNOSIS — F331 Major depressive disorder, recurrent, moderate: Secondary | ICD-10-CM

## 2011-09-12 DIAGNOSIS — F332 Major depressive disorder, recurrent severe without psychotic features: Secondary | ICD-10-CM

## 2011-09-12 NOTE — Progress Notes (Signed)
   THERAPIST PROGRESS NOTE  Session Time: 10:30am-11:20am  Participation Level: Active  Behavioral Response: Well GroomedAlertAngry, Anxious, Depressed and Hopeless  Type of Therapy: Individual Therapy  Treatment Goals addressed: Anger, Anxiety, Communication: with fiancee about ending the relationship, Coping and Diagnosis: depression.  Interventions: CBT, Strength-based, Supportive, Reframing and Other: grief   Summary: Kacey Dysert is a 35 y.o. female who presents with depressed mood and tearful affect. She is tearful throughout the session as she reports that her 39 year old niece committed suicide last week by an overdose. She discusses traveling home for the funeral and processes her anger with her family, particularly, some of her siblings who did not attend the funeral. She reports anger with a cousin for negatively commenting on the care her parents are receiving. Explored patients thoughts and feelings related to this suicide and patient reports initial anger because her niece successfully killed herself, but she has not been successful in the past. Assessed patients risk to self and she reports that she is not suicidal, but concerned that her depression has worsened and she is in a "dark place". Normalized patients grief reaction, educated her on the initial shock and disbelief she continues to feel and provided support as she expressed her confusion. Her job has ended and she is now laid off and did not get the job she had hoped for. Patient demonstrates and expresses concern that she has no schedule and without structure she is fearful that her depression will worsen. She has also taken off her engagement ring and expresses anger with her fiancee. Processed her uncertainty about this relationship and her current approach of ignoring him and pushing him away because he upsets her even more. She is unable to sleep and has a very poor appetite. She will see Dr. Lolly Mustache this afternoon and this  therapist will relate this information to Dr. Lolly Mustache.   Suicidal/Homicidal: Nowithout intent/plan  Therapist Response: Assisted patient with the expression of her feelings of sadness, confusion, anger and despair. She is having a normal reaction to the shock of suicide and due to her own experiences with suicide, she demonstrates a deeper understanding of why her niece may have killed herself. She is struggling to find hope in the midst of her current crises involving job loss, suicide, ailing parents and the possible end of her engagement. Her support system remains limited and with her isolation, there is concern that patient will not talk to others for help and support. Discussed this therapists absence and patients initial plan of waiting until January to return for services, but in light of recent events, it is recommended that patient follow up with Forde Radon, [facility] and [facility] for grief counseling. Discussed and recommended Psych IOP, which she states she will consider, but is cautious due to it being a group.Reviewed patients self care plan. Assessed her progress related to self care. Patient's self care is challenged right now by crisis. Recommend proper diet, regular exercise, socialization and recreation. Patient denies suicidal and homicidal ideation, intent or plan. Patient leaves with an appointment to see Forde Radon, [facility] and will continue to see Dr. Lolly Mustache.   Plan: Return again in 8 weeks.  Diagnosis: Axis I: Major Depression, Rec    Axis II: No diagnosis    [place]on [place], LCSW 09/12/2011

## 2011-09-12 NOTE — Progress Notes (Signed)
Patient into day for her followup appointment she was also seen by therapist earlier today. Patient endorsed decreased energy and sad mood as last week one of her niece committed suicide. She was living in [place] and apparently taken an overdose on the medication. Patient has difficulty going through the state incident however she forgot to talking to therapist she is feeling better. Patient attended funeral and came back from [place] 2 days ago. She was also find out that she laid out today from her job but in a way she is much relief as she has she continued to have struggled working there. She will get unemployment and also she is going to start for a new job. She she's also scheduled to see [facility] as her current therapist is going on a medical leave. She reported that she was doing much better on Pamelor until last week when she got the news about her niece. I have recommended intensive outpatient program and she has been thinking seriously and may consider starting the program very soon. She denies any agitation anger mood swings or paranoid thinking. She continues to have a headache but there has been not more intense then usual. She does not want to add the medication and feels Pamelor is working very well. There are no crying spells but he does mention tearfulness at times.  Mental status examination patient is casually dressed and fairly groomed. Her speech is slow but soft and clear. Her thought process is also slowed but logical linear and goal-directed. She described her mood as sad and past affect was constricted but she denies any active or passive suicidal thinking homicidal thinking. There were no psychotic symptoms present. Her attention and concentration is fair. Her memory is good she has alert and oriented x3. Her insight judgment and impulse control disorder.  Assessment Maj. depressive disorder  Plan we will continue her Pamelor 50 mg 3 at bedtime. I reinforced and recommended  to consider intensive outpatient program. However patient will make decision and asked for the 4 hours. We also talked about safety plan that in case if she feels worsening of her depression or any time having suicidal thinking or homicidal thinking then she need to call us immediately or go to local emergency room. I will see her again in 2 weeks. She has Pamelor which will until last for next appointment.

## 2011-09-20 ENCOUNTER — Other Ambulatory Visit (HOSPITAL_COMMUNITY): Payer: Self-pay | Admitting: Psychiatry

## 2011-10-03 ENCOUNTER — Ambulatory Visit (INDEPENDENT_AMBULATORY_CARE_PROVIDER_SITE_OTHER): Payer: 59 | Admitting: Psychiatry

## 2011-10-03 DIAGNOSIS — F329 Major depressive disorder, single episode, unspecified: Secondary | ICD-10-CM

## 2011-10-03 NOTE — Progress Notes (Signed)
Patient came for her followup appointment. She had visited [place] and [place] to visit her parents. She reported she had a very good visit and she planning to go back see them again in January. Overall she has been improving. She is sleeping better and denies any crying spells. She reported her stress level is much reduced since she laid off from the job. She is taking Pamelor 150 mg at bedtime and recently she has no headache. She denies any crying spells agitation anger or mood swings. She is scheduled to see therapist tomorrow. She is still thinking about intensive outpatient program and will make a decision in next few days. She reported no side effects of medication.  Mental status examination Patient is calm cooperative and pleasant. She described her mood is anxious and depressed. Her affect is constricted. She denies any active or passive suicidal thinking or homicidal thinking. Her attention and concentration is fair. There are no psychotic symptoms present. She denies any auditory or visual hallucination. Her speech is slow but soft and coherent. She's alert and oriented x3. Her insight judgment and pulse control is okay.  Assessment Maj. depressive disorder  Plan I will continue Pamelor 50 mg 3 at bedtime. I have explained risks and benefits of medication in detail. She is scheduled to see therapist tomorrow morning. She will consider restarting intensive outpatient program. We talk about safety plan, I recommended to call if she has any question or concern about the medication or if she feels worsening of her symptoms. I will see her again in 6 weeks

## 2011-10-04 ENCOUNTER — Ambulatory Visit (INDEPENDENT_AMBULATORY_CARE_PROVIDER_SITE_OTHER): Payer: 59 | Admitting: Psychology

## 2011-10-04 DIAGNOSIS — F331 Major depressive disorder, recurrent, moderate: Secondary | ICD-10-CM

## 2011-10-04 NOTE — Progress Notes (Signed)
   THERAPIST PROGRESS NOTE  Session Time: 9.50am-10.25am  Participation Level: Active  Behavioral Response: Well GroomedAlertDepressed  Type of Therapy: Individual Therapy  Treatment Goals addressed: Diagnosis: MDD  Interventions: Solution Focused and Strength-based  Summary: Cierrah Phagan is a 35 y.o. female who presents for f/u treatment w/ this provider as referred by her primary therapist Geanie Berlin, who is currently on leave.   Pt reported mood less depressed and pt has been keeping aware of need to "do something each day" to get out of the house and not stay on the couch.  Pt insightful about positive effect for self spending time w/ family and extended family recently.  Pt identified need for her to remain in contact w/ those of family who are supportive.  Pt also discussed need to stay active and find things that she enjoys doing w/ this time "off".  Pt discussed importance of NewYears for her w/ feeling will be new start and putting closure to this year.  Pt agreed to have assessment for IOP as has been recommended, keep in contact w/ supports everyother day, take part in activity outside of house daily.   Suicidal/Homicidal: Nowithout intent/plan  Therapist Response: Introduced self to pt and explored w/ pt her needs for counseling during primary therapist's absence.  Focused on Strengths based approach w/ pt identifying recent positives and insight and solution focused- having pt identify what she wants to commit to during our work together.    Plan: Return again in 3 weeks. Pt to meet w/ assessment dept for IOP screening within the next 3 days.  Diagnosis: Axis I: Major Depression, Recurrent moderate    Axis II: No diagnosis    Marianita Botkin, LPC 10/04/2011

## 2011-10-06 ENCOUNTER — Ambulatory Visit (HOSPITAL_COMMUNITY)
Admission: RE | Admit: 2011-10-06 | Discharge: 2011-10-06 | Disposition: A | Discharge status: Home / Self Care | Payer: 59 | Attending: Psychiatry | Admitting: Psychiatry

## 2011-10-06 DIAGNOSIS — F339 Major depressive disorder, recurrent, unspecified: Secondary | ICD-10-CM | POA: Insufficient documentation

## 2011-10-06 NOTE — BH Assessment (Signed)
Assessment Note   Tracy Li is an 35 y.o. female. PT PRESENTS WITH INCREASE DEPRESSION & WAS REFERRED BY HER PSYCHIATRIST AS WELL AS  THERAPIST. PT STATES SHE HAS BEEN GOING THRU A LOT WITH PARENTS & SISTER BEING SICK & SIBLINGS NOT BEING SUPPORTIVE. PT STATES AT TIMES SHE FEELS HELPLESS BUT KNOWS SHE CANT DO ANYTHING ABOUT IT. PT STATES SHE ALSO HAD A COUSIN COMMIT SUICIDE WHICH WAS A SHOCK TO THE ENTIRE FAMILY. PT IS QUITE EMOTIONAL & ADMITS TO GRIEVING OVER HER CURRENT LOSS & TURMOIL. PT DENIES ANY IDEATION, HALLUCINATION & IS ABLE TO CONTRACT FOR SAFETY. PT SAYS SHE WAS ALSO RECENTLY LAID OFF FROM WORK & HER PSYCHIATRIST HAS BEEN TRYING TO GET HER TO GO TO PSYCH IOP FOR 2 YRS BUT NOW THAT SHE HAS TIME & HAS BEEN VERY DEPRESSED, SHE NEEDED THE GROUP SUPPORT. PT ADMITS TO DECREASE SLEEP (3HRS) & EATING IS OK. TO SOME DEGREE  FEELS HOPELESS & NOT SURE IF MEDS ARE WORKING BUT SAYS THERAPY HAS BEEN A GOOD OUTLET FOR HER. PT HAS AGREED TO START PSYCH IOP ON 10/10/11.  Axis I: Bereavement and Major Depression, Recurrent severe Axis II: Deferred Axis III:  Past Medical History  Diagnosis Date  . Anxiety   . Depression   . Fatigue   . Headache   . Endometriosis    Axis IV: problems related to social environment, problems with primary support group and RELATIONSHIP ISSUES & GRIEF Axis V: 51-60 moderate symptoms  Past Medical History:  Past Medical History  Diagnosis Date  . Anxiety   . Depression   . Fatigue   . Headache   . Endometriosis     Past Surgical History  Procedure Date  . Laparoscopy     Family History:  Family History  Problem Relation Age of Onset  . Alzheimer's disease Mother   . Depression Mother   . Depression Father   . Depression Cousin   . Depression Other   . Suicidality Other     Social History:  reports that she has never smoked. She does not have any smokeless tobacco history on file. She reports that she does not use illicit drugs. Her alcohol history  not on file.  Additional Social History:    Allergies: No Known Allergies  Home Medications:  Medications Prior to Admission  Medication Sig Dispense Refill  . nortriptyline (PAMELOR) 50 MG capsule TAKE THREE CAPSULES BY MOUTH AT BEDTIME  90 capsule  1   No current facility-administered medications on file as of 10/06/2011.    OB/GYN Status:  No LMP recorded.                                                      Disposition: PSYCH IOP    On Site Evaluation by:   Reviewed with Physician:     Waldron Session 10/06/2011 3:37 PM

## 2011-10-26 ENCOUNTER — Ambulatory Visit (HOSPITAL_COMMUNITY): Payer: 59 | Admitting: Psychology

## 2011-11-10 ENCOUNTER — Ambulatory Visit (HOSPITAL_COMMUNITY): Payer: 59 | Admitting: Licensed Clinical Social Worker

## 2011-11-16 ENCOUNTER — Ambulatory Visit (INDEPENDENT_AMBULATORY_CARE_PROVIDER_SITE_OTHER): Payer: 59 | Admitting: Licensed Clinical Social Worker

## 2011-11-16 ENCOUNTER — Encounter (HOSPITAL_COMMUNITY): Payer: Self-pay | Admitting: Licensed Clinical Social Worker

## 2011-11-16 DIAGNOSIS — F332 Major depressive disorder, recurrent severe without psychotic features: Secondary | ICD-10-CM

## 2011-11-16 NOTE — Progress Notes (Signed)
   THERAPIST PROGRESS NOTE  Session Time: 11:30am-12:20pm  Participation Level: Active  Behavioral Response: Well GroomedAlertAnxious and Depressed  Type of Therapy: Individual Therapy  Treatment Goals addressed: Anxiety, Coping and Diagnosis: depression   Interventions: Motivational Interviewing, Solution Focused and Supportive  Summary: Tracy Li is a 36 y.o. female who presents with depressed/anxious mood and labile affect. Patient is resuming treatment with this therapist after seeing Forde Radon, LPC once while this therapist was on leave. Patient reports continued depression, anxiety, tearfulness, poor motivation and isolation. She expresses concern over being out of work and unable to find a job. She expresses fear over her unemployment, finances, her family and her future. She visited her parents and talks positively about this visit. She is tearful when discussing her mothers progression with Alzhimers disease. She struggles to find peace with how much she has helped her parents. She remains frustrated with most aspects of her life. Both sleep and appetite are poor. She has tried to stay active, interact with others, understanding the importance of this, but feels she is loosing motivation to do anything. She engages in negative self talk about her efforts to take care of herself and has been able to sustain behavioral changes for only one week.    Suicidal/Homicidal: Nowithout intent/plan  Therapist Response: Patients depression and anxiety remain present. She did experience some improvement soon after she was laid off from her job, but has fallen back into her depressive behavioral patters. She demonstrates self doubt and a lack of confidence in her decision making skills. She avoids conflict by remaining in a relationship with her boyfriend, despite her desire not to. Focused on conflict avoidance. Used CBT to assist patient with the identification of her negative distortions and  irrational thoughts. Encouraged patient to verbalize alternative and factual responses which challenge her distortions. Used motivational interviewing to assist and encourage patient through the change process. Explored patients barriers to change. Reviewed patients self care plan. Assessed her progress related to self care. Patient's self care is fair. Recommend proper diet, regular exercise, socialization and recreation.   Plan: Return again in one weeks.  Diagnosis: Axis I: Major Depression, Recurrent severe    Axis II: Deferred    Crisanto Nied, LCSW 11/16/2011

## 2011-11-17 ENCOUNTER — Ambulatory Visit (HOSPITAL_COMMUNITY): Payer: 59 | Admitting: Licensed Clinical Social Worker

## 2011-11-18 ENCOUNTER — Ambulatory Visit (INDEPENDENT_AMBULATORY_CARE_PROVIDER_SITE_OTHER): Payer: 59 | Admitting: Psychiatry

## 2011-11-18 ENCOUNTER — Encounter (HOSPITAL_COMMUNITY): Payer: Self-pay | Admitting: Psychiatry

## 2011-11-18 VITALS — Wt 236.0 lb

## 2011-11-18 DIAGNOSIS — F331 Major depressive disorder, recurrent, moderate: Secondary | ICD-10-CM

## 2011-11-18 MED ORDER — NORTRIPTYLINE HCL 50 MG PO CAPS: ORAL_CAPSULE | ORAL | Status: DC

## 2011-11-18 NOTE — Progress Notes (Signed)
Patient came for her followup appointment. She has a busy Christmas. She reported there were 3 kids in her house around Christmastime. She continues to have struggle sleeping the she's been compliant with her medication. She reported no side effects of medication. She is actively looking for a job and she is hopeful as there are a few interviews and lineup. She denies any agitation anger or mood swings but reported anxiety and nervousness. She denies any drinking or using drugs. She denies any crying spells or social isolation.  Mental status examination Patient is calm cooperative and pleasant. She described her mood is anxious and depressed. Her affect is constricted. She denies any active or passive suicidal thinking or homicidal thinking. Her attention and concentration is improved from the past. There are no psychotic symptoms present. She denies any auditory or visual hallucination. Her speech is slow but soft and coherent. She's alert and oriented x3. Her insight judgment and pulse control is okay.  Assessment Maj. depressive disorder  Plan I will continue Pamelor 50 mg 3 at bedtime. I have explained risks and benefits of medication in detail. She has started seeing therapist in this office. She reported no side effects of medication. She has no tremors or shakes. I recommended to call if she has any question or concern about the medication or if she feels worsening of her symptoms. I will see her again in 8 weeks

## 2011-11-24 ENCOUNTER — Encounter (HOSPITAL_COMMUNITY): Payer: Self-pay | Admitting: Licensed Clinical Social Worker

## 2011-11-24 ENCOUNTER — Ambulatory Visit (INDEPENDENT_AMBULATORY_CARE_PROVIDER_SITE_OTHER): Payer: 59 | Admitting: Licensed Clinical Social Worker

## 2011-11-24 DIAGNOSIS — F332 Major depressive disorder, recurrent severe without psychotic features: Secondary | ICD-10-CM

## 2011-11-24 NOTE — Progress Notes (Signed)
   THERAPIST PROGRESS NOTE  Session Time: 1:00pm-1:50pm  Participation Level: Active  Behavioral Response: NeatAlertAnxious and Depressed  Type of Therapy: Individual Therapy  Treatment Goals addressed: Anxiety, Coping and Diagnosis: depression  Interventions: CBT, Motivational Interviewing and Supportive  Summary: Tracy Li is a 36 y.o. female who presents with depressed mood and tearful affect. She is upset and frustrated with her boyfriend and describes her weekend with him, which involved arguments and confusion over mixed messages. Patient is trying to decide if she will break up with him and move back to [place]. She is easily overwhelmed, tearful and discusses her feelings of a loss of control. She is trying to make decisions, but is fearful to make the wrong one. She is influenced by family members, who invalidate and criticize her ideas. She has little motivation, has increased her isolation, remains on the couch all day and sleeps there at night. Her sleep is increased and her appetite is poor. She speaks negatively about herself and does not demonstrate confidence in her abiiyty to make good choices.    Suicidal/Homicidal: Nowithout intent/plan  Therapist Response: Processed w/pt her feelings about her relationship. Explored her fear of ending the relationship and the cost to her emotionally if she continues. Used CBT to assist patient with the identification of her negative distortions and irrational thoughts. Encouraged patient to verbalize alternative and factual responses which challenge her distortions. Used motivational interviewing to assist and encourage patient through the change process. Explored patients barriers to change. Reviewed patients self care plan. Assessed her progress related to self care. Patient's self care is poor. Recommend proper diet, regular exercise, socialization and recreation. Recommend patient attend Psych IOP. She is hesitant and wants to think about it.  Educated her on the benefits this program could provide her.   Plan: Return again in one weeks.  Diagnosis: Axis I: Major Depression, Recurrent severe    Axis II: Deferred    Tracy Zazueta, LCSW 11/24/2011

## 2011-11-30 ENCOUNTER — Encounter (HOSPITAL_COMMUNITY): Payer: Self-pay | Admitting: Licensed Clinical Social Worker

## 2011-11-30 ENCOUNTER — Ambulatory Visit (INDEPENDENT_AMBULATORY_CARE_PROVIDER_SITE_OTHER): Payer: 59 | Admitting: Licensed Clinical Social Worker

## 2011-11-30 DIAGNOSIS — F332 Major depressive disorder, recurrent severe without psychotic features: Secondary | ICD-10-CM

## 2011-11-30 NOTE — Progress Notes (Signed)
   THERAPIST PROGRESS NOTE  Session Time: 8:30am-9:20am  Participation Level: Active  Behavioral Response: CasualAlertAnxious and Depressed  Type of Therapy: Individual Therapy  Treatment Goals addressed: Anxiety and Coping  Interventions: CBT, Motivational Interviewing, Strength-based and Supportive  Summary: Tracy Li is a 36 y.o. female who presents with depressed mood and tearful affect. She appears tired and reports that she didn't sleep well last night following an argument with her now ex-fiancee. She reports that she has finally realized that he doesn't want to get married or have kids. She states that the realization that he does not want to have children hit her like a brick and that she has been in denial. She plans to tell him to move out of her home today. She expresses hopelessness that her life cannot be different from how it has been. She remains without a job and uncertain what she will do and where she will live once she runs out of money next month. She has increased her isolation and "does not want to be bothered with people". Her sleep is poor and her appetite is inconsistent.    Suicidal/Homicidal: Nowithout intent/plan  Therapist Response: Processed w/pt her feelings about ending her relationship of four years. She engages in strong negative self talk today and needs redirection and reframing. She is challenged by the idea of hope and struggles to believe in a different way of life. She is unwilling to attend Psych IOP as this therapist recommends. Discussed next steps of setting clear boundaries. Used CBT to assist patient with the identification of her negative distortions and irrational thoughts. Encouraged patient to verbalize alternative and factual responses which challenge her distortions. Used motivational interviewing to assist and encourage patient through the change process. Explored patients barriers to change. Reviewed patients self care plan. Assessed her  progress related to self care. Patient's self care is poor. Recommend proper diet, regular exercise, socialization and recreation.   Plan: Return again in one weeks.  Diagnosis: Axis I: Major Depression, Recurrent severe    Axis II: Deferred    Jessy Calixte, LCSW 11/30/2011

## 2011-12-07 ENCOUNTER — Encounter (HOSPITAL_COMMUNITY): Payer: Self-pay | Admitting: Licensed Clinical Social Worker

## 2011-12-07 ENCOUNTER — Ambulatory Visit (INDEPENDENT_AMBULATORY_CARE_PROVIDER_SITE_OTHER): Payer: 59 | Admitting: Licensed Clinical Social Worker

## 2011-12-07 DIAGNOSIS — F332 Major depressive disorder, recurrent severe without psychotic features: Secondary | ICD-10-CM

## 2011-12-07 NOTE — Progress Notes (Signed)
   THERAPIST PROGRESS NOTE  Session Time: 8:30am-9:20am  Participation Level: Active  Behavioral Response: Fairly GroomedAlertAnxious, Depressed and Hopeless  Type of Therapy: Individual Therapy  Treatment Goals addressed: Anxiety, Coping and Diagnosis: derpession  Interventions: CBT, Motivational Interviewing, Strength-based and Supportive  Summary: Tracy Li is a 36 y.o. female who presents with depressed mood and tearful affect. She reports continued and worsening depression over the past week and states that she has gotten to "that place" she has been trying to avoid, meaning deep depression. When asked about suicide, she denies any thoughts or desire, and states that "I can't even do that right", so why bother. She is hopeless about her future and endorses feeling rejected because she has been receiving letters of rejection for jobs she applied for. She is angry with her ex-fiancee because he refuses to accept the fact that she has ended the relationship. She spends her days on the couch, watching TV and sleeping. Her ADL's are poor and she only bathes twice per week, it that. Her sleep has increased and her appetite has decreased.    Suicidal/Homicidal: Nowithout intent/plan  Therapist Response: Patient is tearful throughout the entire session today. She is frustrated with her life circumstances. She lacks motivation and purpose. Processed w/pt her feelings of rejection from her family, her ex-fiancee and employers. Her negative self talk is strong and her insight into her behavioral patterns which perpetuate her depression and anxiety is limited. Recommend Psych IOP again, but patient does not want to attend. Discussed inpatient hospitalization if she becomes a danger to herself or others. She commits to safety and commits to contact this therapist or assessment/ED if she becomes unsafe. Used CBT to assist patient with the identification of her negative distortions and irrational  thoughts. Encouraged patient to verbalize alternative and factual responses which challenge her distortions. Used motivational interviewing to assist and encourage patient through the change process. Explored patients barriers to change. Reviewed patients self care plan. Assessed her progress related to self care. Patient's self care is poor. Recommend proper diet, regular exercise, socialization and recreation. Set goals with patient: bath every other day due to skin condition, change out of sleep clothes, leave the house daily and increase time spent around others.   Plan: Return again in one weeks.  Diagnosis: Axis I: Major Depression, Recurrent severe    Axis II: No diagnosis    Cortland Crehan, LCSW 12/07/2011

## 2011-12-08 ENCOUNTER — Ambulatory Visit (HOSPITAL_COMMUNITY): Payer: 59 | Admitting: Licensed Clinical Social Worker

## 2011-12-14 ENCOUNTER — Ambulatory Visit (INDEPENDENT_AMBULATORY_CARE_PROVIDER_SITE_OTHER): Payer: 59 | Admitting: Licensed Clinical Social Worker

## 2011-12-14 DIAGNOSIS — F332 Major depressive disorder, recurrent severe without psychotic features: Secondary | ICD-10-CM

## 2011-12-14 NOTE — Progress Notes (Signed)
   THERAPIST PROGRESS NOTE  Session Time: 9:30am-10:20am  Participation Level: Active  Behavioral Response: Fairly GroomedAlertDepressed and Hopeless  Type of Therapy: Individual Therapy  Treatment Goals addressed: Anxiety, Coping and Diagnosis: depression  Interventions: CBT, Motivational Interviewing, Strength-based and Supportive  Summary: Tracy Li is a 36 y.o. female who presents with depressed mood and flat affect. She is doing poorly, sleeping during the day, with hopeless and negative thoughts racing in her mind when she is awake. She is pleased that her ex-fiancee is out of her life, but frusterated that he continues to send her text messages. She is lonely, but feels clearer about her decision because he does not want children. She has filed for unemployment and finds this hopeful. She realized that she did not look for a job last week and that she is disconnected from focusing on her goals. She knows what she needs to do, such as attend a meeting at the [employer], reach out to friends at the [employer], socialize and spend time outside her apartment. She has no motivation to do any of this. She does not want to attend Psych IOP. She is hopeless about her future and about "anything going right in my life". She is waiting for something to "go right" in order to carry out goals listed above. Her sleep is increased and her appetite is poor.   Suicidal/Homicidal: Yeswithout intent/plan  Therapist Response: Patient depression and anxiety remain high. He admits to suicidal thoughts, without intent or plan and talks about how her parents are a clear detterant for her and how she could not hurt them by killing herself. She does endorse thoughts of SIB, such as cutting, but has not done this. She has cut once before maybe 10 years ago, but none since then. She is resistant to a higher level of care such as Psych IOP or admission to [facility]. Challenged her resistance, pointing out how she will not  improve if she does not do anything different. Provided her with information for [facility] and encouraged her to attend classes through the [facility]. Used CBT to assist patient with the identification of her negative distortions and irrational thoughts. Encouraged patient to verbalize alternative and factual responses which challenge her distortions. Used motivational interviewing to assist and encourage patient through the change process. Explored patients barriers to change. Reviewed patients self care plan. Assessed her progress related to self care. Patient's self care is poor. Recommend proper diet, regular exercise, socialization and recreation. Reviewed crisis plan with patient, which involves contacting this therapist, calling 911 or going to the nearest emergency room. Patient agrees and commits to safety.   Plan: Return again in one weeks.  Diagnosis: Axis I: Major Depression, Recurrent severe    Axis II: No diagnosis    [name], LCSW 12/14/2011

## 2011-12-19 ENCOUNTER — Ambulatory Visit (INDEPENDENT_AMBULATORY_CARE_PROVIDER_SITE_OTHER): Payer: 59 | Admitting: Licensed Clinical Social Worker

## 2011-12-19 DIAGNOSIS — F332 Major depressive disorder, recurrent severe without psychotic features: Secondary | ICD-10-CM

## 2011-12-19 NOTE — Progress Notes (Signed)
   THERAPIST PROGRESS NOTE  Session Time: 8:30am-9:20am  Participation Level: Active  Behavioral Response: Fairly GroomedAlertDepressed and Hopeless  Type of Therapy: Individual Therapy  Treatment Goals addressed: Anxiety, Coping and Diagnosis: derpession  Interventions: CBT, Motivational Interviewing and Supportive  Summary: Tracy Li is a 36 y.o. female who presents with depressed mood and flat affect. She reports no improvement in her depression and anxiety over the past week. She still has daily crying spells which are not controllable, has no motivation, poor ADL's, poor appetite and increased sleep. She complains of not sleeping, but admits to sleeping most of the day. She has been taking Tylenol PM during the day and at night to help relax her and put her to sleep. Instead of taking the recommended does of 2, she is taking up to 5. Educated patient on the danger of this practice and recommend she stop taking this immediately. She is upset that she continues to be turned down for jobs she is applying for. She wants to accomplish the goals she knows she "should", but cannot get herself motivated. She questions seeing Dr. Lolly Li next week because she has has no success with medication and believes nothing will help.    Suicidal/Homicidal: Nowithout intent/plan  Therapist Response: Patients depression and anxiety remain strong. Her cognitive distortions are strong encouraging her belief of hopelessness. Challenged her hopelessness, encouraging her to leave her home more often. Used CBT to assist patient with the identification of her negative distortions and irrational thoughts. Encouraged patient to verbalize alternative and factual responses which challenge her distortions. Used motivational interviewing to assist and encourage patient through the change process. Explored patients barriers to change. Reviewed patients self care plan. Assessed her progress related to self care. Patient's  self care is poor. Recommend proper diet, regular exercise, socialization and recreation.  Recommend patient attend Psych IOP, which she is unwilling to do. Recommend patient follow up with [facility] to attend [facility].    Plan: Return again in one weeks.  Diagnosis: Axis I: Major Depression, Recurrent severe    Axis II: No diagnosis    Tracy Reek, LCSW 12/19/2011

## 2011-12-21 ENCOUNTER — Ambulatory Visit (HOSPITAL_COMMUNITY): Payer: Self-pay | Admitting: Licensed Clinical Social Worker

## 2011-12-26 ENCOUNTER — Ambulatory Visit (INDEPENDENT_AMBULATORY_CARE_PROVIDER_SITE_OTHER): Payer: 59 | Admitting: Psychiatry

## 2011-12-26 ENCOUNTER — Encounter (HOSPITAL_COMMUNITY): Payer: Self-pay | Admitting: Psychiatry

## 2011-12-26 ENCOUNTER — Ambulatory Visit (INDEPENDENT_AMBULATORY_CARE_PROVIDER_SITE_OTHER): Payer: 59 | Admitting: Licensed Clinical Social Worker

## 2011-12-26 DIAGNOSIS — F329 Major depressive disorder, single episode, unspecified: Secondary | ICD-10-CM

## 2011-12-26 DIAGNOSIS — F332 Major depressive disorder, recurrent severe without psychotic features: Secondary | ICD-10-CM

## 2011-12-26 MED ORDER — LITHIUM CARBONATE ER 300 MG PO TBCR: 300.0000 mg | EXTENDED_RELEASE_TABLET | Freq: Every day | ORAL | Status: DC

## 2011-12-26 NOTE — Progress Notes (Signed)
   THERAPIST PROGRESS NOTE  Session Time: 9:30am-10:20am  Participation Level: Active  Behavioral Response: Fairly GroomedAlertAnxious and Depressed  Type of Therapy: Individual Therapy  Treatment Goals addressed: Anxiety, Coping and Diagnosis: depression  Interventions: CBT, Motivational Interviewing and Strength-based  Summary: Tracy Li is a 35 y.o. female who presents with depressed mood and flat affect. Her depression has worsened over the past week and she reports sleeping during the day and at night to avoid her feelings. She is overwhelmed by her depression and expresses fear that soon she will be unable to pay her bills. She is uncertain what to do about a job and is considering returning to [employer] to work under her ex-fiancee. She explores her concerns about this job, about her ability to tolerate working under him and her fear that she will "be under the microscope" by her peers for being treated preferentially. She is having daily headaches again, which start around 4pm to 5pm. Her appetite is inconsistent as well. Her isolation has worsened and she has decreased the frequency with which she talks to her family. She remains resistant to any higher level of care and while she "knows what I need to do", she is incapable at this time.    Suicidal/Homicidal: Nowithout intent/plan  Therapist Response: Patient remains resistant to a higher level of care, which is needed. She does not meet criteria for inpatient admission and remains unwilling to attend Psych IOP. Processed w/ pt her unrealistic belief that she can fix this on her own. Used CBT to assist patient with the identification of her negative distortions and irrational thoughts. Encouraged patient to verbalize alternative and factual responses which challenge her distortions. Used motivational interviewing to assist and encourage patient through the change process. Explored patients barriers to change. Patients desire for  avoidance remains her primary barrier to change. Used DBT to help build distress tolerance skills. Helped her identify and set goals for this week. Reviewed patients self care plan. Assessed her progress related to self care. Patient's self care is poor. Recommend proper diet, regular exercise, socialization and recreation.   Plan: Return again in one weeks.  Diagnosis: Axis I: Major Depression, Recurrent severe    Axis II: No diagnosis    Tracy Ridgeway, LCSW 12/26/2011

## 2011-12-26 NOTE — Progress Notes (Addendum)
Chief complaint I have been feeling more depressed  History of presenting illness Patient's 36 year old [place] American female who came for her followup appointment. She has been complaining of depressive thoughts and crying spells. She also complained of increased tiredness, headache and no energy. She is concerned about her future since she does not have a job, she is worried about her financial bills. She is taking Pamelor 50 mg 3 at bedtime. She admitted having passive suicidal thinking 2 weeks ago however currently she denies any active or passive suicidal thoughts. She denies any agitation anger or mood swings but reported social isolation and withdrawn. She is looking for a job however has not have any hope yet. She denies any side effects of medication.  Mental status examination Patient is casually dressed and fairly groomed. She described her mood is depressed and anxious and her affect is constricted. She is easily tearful. She maintained fair eye contact. She denies any active or passive suicidal thinking and homicidal thinking. There no psychotic symptoms present at this time. Her attention and concentration is fair. Her thought process is slow but logical. She's alert and oriented x3 her insight judgment and impulse control is okay.  Diagnosis Axis I Major depressive disorder Axis II deferred Axis III headache Axis IV moderate Axis 60-65  Plan I review the chart. Patient has been on lithium and Abilify in the past however decided to stop when she was out of town on family visit and did not bring her medication and felt better without Abilify and lithium. I encourage her to consider taking lithium again to help her depressive thoughts. Patient agreed with the plan. I will restart lithium 300 mg daily along with Pamelor 50 mg 3 at bedtime. I have explained risks and benefits of medication. She will continue to see therapist. We discussed safety plan that at any time she is started to  feel worsening of her symptoms or having suicidal thinking and homicidal thinking then she need to call 911 or go to local ER. I will see her again in 2 weeks

## 2011-12-28 ENCOUNTER — Ambulatory Visit (HOSPITAL_COMMUNITY): Payer: Self-pay | Admitting: Licensed Clinical Social Worker

## 2012-01-16 ENCOUNTER — Ambulatory Visit (HOSPITAL_COMMUNITY): Payer: 59 | Admitting: Psychiatry

## 2012-01-20 ENCOUNTER — Telehealth (HOSPITAL_COMMUNITY): Payer: Self-pay | Admitting: Licensed Clinical Social Worker

## 2012-01-20 NOTE — Telephone Encounter (Signed)
Called patient back after receiving notice that she has not been approved for charity care, which means she can no longer attend therapy due to her unemployment status, without insurance. Left message for patient to return call to check in with patient and provide her with community resources for reduced rate or free therapy.

## 2012-01-23 ENCOUNTER — Other Ambulatory Visit (HOSPITAL_COMMUNITY): Payer: Self-pay | Admitting: Psychiatry

## 2012-01-24 ENCOUNTER — Other Ambulatory Visit (HOSPITAL_COMMUNITY): Payer: Self-pay | Admitting: Psychiatry

## 2012-02-08 ENCOUNTER — Other Ambulatory Visit (HOSPITAL_COMMUNITY): Payer: Self-pay | Admitting: Psychiatry

## 2012-02-08 DIAGNOSIS — F329 Major depressive disorder, single episode, unspecified: Secondary | ICD-10-CM

## 2012-02-29 ENCOUNTER — Other Ambulatory Visit (HOSPITAL_COMMUNITY): Payer: Self-pay | Admitting: Psychiatry

## 2012-02-29 DIAGNOSIS — F329 Major depressive disorder, single episode, unspecified: Secondary | ICD-10-CM

## 2012-04-01 ENCOUNTER — Other Ambulatory Visit (HOSPITAL_COMMUNITY): Payer: Self-pay | Admitting: Psychiatry

## 2012-04-12 ENCOUNTER — Other Ambulatory Visit (HOSPITAL_COMMUNITY): Payer: Self-pay | Admitting: Psychiatry

## 2012-04-13 NOTE — Telephone Encounter (Signed)
No further prescriptions unless patient comes in to see Dr. Lolly Mustache

## 2012-04-16 ENCOUNTER — Other Ambulatory Visit (HOSPITAL_COMMUNITY): Payer: Self-pay | Admitting: *Deleted

## 2012-05-01 ENCOUNTER — Other Ambulatory Visit (HOSPITAL_COMMUNITY): Payer: Self-pay | Admitting: Psychiatry

## 2012-05-06 ENCOUNTER — Other Ambulatory Visit (HOSPITAL_COMMUNITY): Payer: Self-pay | Admitting: Psychiatry

## 2012-05-06 DIAGNOSIS — F329 Major depressive disorder, single episode, unspecified: Secondary | ICD-10-CM

## 2012-05-07 ENCOUNTER — Telehealth (HOSPITAL_COMMUNITY): Payer: Self-pay

## 2012-05-07 ENCOUNTER — Other Ambulatory Visit (HOSPITAL_COMMUNITY): Payer: Self-pay | Admitting: *Deleted

## 2012-05-07 MED ORDER — LITHIUM CARBONATE ER 300 MG PO TBCR: 300.0000 mg | EXTENDED_RELEASE_TABLET | Freq: Every day | ORAL | Status: DC

## 2012-05-07 NOTE — Addendum Note (Signed)
Addended by: Tonny Bollman on: 05/07/2012 04:36 PM   Modules accepted: Orders

## 2012-05-07 NOTE — Telephone Encounter (Signed)
3:10PM 05/07/12 S/W PT AFTER TALKING WITH DR. Lolly Mustache - INFORMED THE PT THAT DR. ARFEEN WILL REFILL HER MEDICATION FOR 30DAYS BUT SHE WILL NEED TO F/U WITH A PROVIDER - POSSIBLE WITH [facility] - PT WANTED TO KNOW WHAT WAS [facility] - INFORMED HER THAT [facility] IS AN [facility] - CONVERSATION ENDED./SH

## 2012-05-07 NOTE — Telephone Encounter (Signed)
05/07/12 2:38PM CALLED S/W PT IN REFERENCE TO HER APPTS - PT STATED THAT SHE DOESN'T HAVE INSURANCE AND THAT IS WHY SHE HAD NOT MADE ANY APPTS - INFORMED PT THAT iI WOULD LET DR. ARFEEN KNOW THE SITUATION AND CALL HER BACK IN REFERENCE TO HER RX REFILL AS WELL.Marguerite Olea

## 2012-10-20 ENCOUNTER — Ambulatory Visit: Payer: Self-pay | Admitting: Family Medicine

## 2012-10-20 VITALS — BP 128/62 | HR 88 | Temp 98.7°F | Resp 18 | Ht 65.0 in | Wt 211.0 lb

## 2012-10-20 DIAGNOSIS — R509 Fever, unspecified: Secondary | ICD-10-CM

## 2012-10-20 DIAGNOSIS — M25569 Pain in unspecified knee: Secondary | ICD-10-CM

## 2012-10-20 DIAGNOSIS — R05 Cough: Secondary | ICD-10-CM

## 2012-10-20 DIAGNOSIS — R059 Cough, unspecified: Secondary | ICD-10-CM

## 2012-10-20 DIAGNOSIS — J029 Acute pharyngitis, unspecified: Secondary | ICD-10-CM

## 2012-10-20 LAB — POCT CBC
HCT, POC: 46.1 % (ref 37.7–47.9)
MCH, POC: 28.4 pg (ref 27–31.2)
MCHC: 30.8 g/dL — AB (ref 31.8–35.4)
MPV: 8.4 fL (ref 0–99.8)
POC Granulocyte: 2.8 (ref 2–6.9)
POC LYMPH PERCENT: 36 %L (ref 10–50)
POC MID %: 14.1 %M — AB (ref 0–12)
Platelet Count, POC: 333 10*3/uL (ref 142–424)
RBC: 5 M/uL (ref 4.04–5.48)
WBC: 5.7 10*3/uL (ref 4.6–10.2)

## 2012-10-20 LAB — POCT INFLUENZA A/B: Influenza A, POC: NEGATIVE

## 2012-10-20 MED ORDER — CEFDINIR 300 MG PO CAPS: 300.0000 mg | ORAL_CAPSULE | Freq: Two times a day (BID) | ORAL | Status: DC

## 2012-10-20 NOTE — Progress Notes (Signed)
[facility] [address], [place] [address] [phone]  Date:  10/20/2012   Name:  Tracy Li   DOB:  1976/08/12   MRN:  [id number]  PCP:  No primary provider on file.    Chief Complaint: Cough and Knee Pain   History of Present Illness:  Tracy Li is a 36 y.o. very pleasant female patient who presents with the following:  Here today with a couple of problems.   She has had a cough for about 2 weeks.  It started out dry, but now she is coughing up material.  The cough took a definite turn for the worse 3 days ago. She is using tylenol cold, did have a fever up to 101 yesterday.  She has been aching in her body.  She has been feeling very tired, her ears hurt, her throat hurts.    She also has problems with her knees- they have been swelling intermittently for the last couple of months.  No pain, no injury. The left is worse than the right.  She thinks this may be due to being on her feet all day.  No history of knee surgery or OA that she is aware of  No stiffness, cracking or popping.    She is on depo- provera, she does not get menses.    There is no problem list on file for this patient.   Past Medical History  Diagnosis Date  . Anxiety   . Depression   . Fatigue   . Headache   . Endometriosis     Past Surgical History  Procedure Date  . Laparoscopy     History  Substance Use Topics  . Smoking status: Never Smoker   . Smokeless tobacco: Not on file  . Alcohol Use: No    Family History  Problem Relation Age of Onset  . Alzheimer's disease Mother   . Depression Mother   . Depression Father   . Depression Cousin   . Depression Other   . Suicidality Other     No Known Allergies  Medication list has been reviewed and updated.  Current Outpatient Prescriptions on File Prior to Visit  Medication Sig Dispense Refill  . chlorhexidine (PERIDEX) 0.12 % solution       . ketoconazole (NIZORAL) 2 % shampoo       . lithium  carbonate (LITHOBID) 300 MG CR tablet Take 1 tablet (300 mg total) by mouth daily.  30 tablet  0  . nortriptyline (PAMELOR) 50 MG capsule TAKE THREE CAPSULES BY MOUTH EVERY DAY AT BEDTIME  90 capsule  0    Review of Systems:  As per HPI- otherwise negative.   Physical Examination: Filed Vitals:   10/20/12 1228  BP: 128/62  Pulse: 88  Temp: 98.7 F (37.1 C)  Resp: 18   Filed Vitals:   10/20/12 1228  Height: 5\' 5"  (1.651 m)  Weight: 211 lb (95.709 kg)   Body mass index is 35.11 kg/(m^2). Ideal Body Weight: Weight in (lb) to have BMI = 25: 149.9   GEN: WDWN, NAD, Non-toxic, A & O x 3, overweight HEENT: Atraumatic, Normocephalic. Neck supple. No masses, No LAD. Bilateral TM wnl, oropharynx normal.  PEERL,EOMI.   Ears and Nose: No external deformity. CV: RRR, No M/G/R. No JVD. No thrill. No extra heart sounds. PULM: CTA B, no wheezes, crackles, rhonchi. No retractions. No resp. distress. No accessory muscle use. ABD: S,  ND, +BS. No rebound. No HSM.  She does have some right sided tenderness that she had not noted prior to exam EXTR: No c/c/e NEURO Normal gait.  PSYCH: Normally interactive. Conversant. Not depressed or anxious appearing.  Calm demeanor.  Knees: no effusion, normal ROM, no heat or redness   Results for orders placed in visit on 10/20/12  POCT CBC      Component Value Range   WBC 5.7  4.6 - 10.2 K/uL   Lymph, poc 2.1  0.6 - 3.4   POC LYMPH PERCENT 36.0  10 - 50 %L   MID (cbc) 0.8  0 - 0.9   POC MID % 14.1 (*) 0 - 12 %M   POC Granulocyte 2.8  2 - 6.9   Granulocyte percent 49.9  37 - 80 %G   RBC 5.00  4.04 - 5.48 M/uL   Hemoglobin 14.2  12.2 - 16.2 g/dL   HCT, POC 11.9  14.7 - 47.9 %   MCV 92.3  80 - 97 fL   MCH, POC 28.4  27 - 31.2 pg   MCHC 30.8 (*) 31.8 - 35.4 g/dL   RDW, POC 82.9     Platelet Count, POC 333  142 - 424 K/uL   MPV 8.4  0 - 99.8 fL  POCT INFLUENZA A/B      Component Value Range   Influenza A, POC Negative     Influenza B, POC  Negative      Assessment and Plan: 1. Sore throat  POCT Influenza A/B, cefdinir (OMNICEF) 300 MG capsule  2. Knee pain    3. Fever  POCT CBC, POCT Influenza A/B  4. Cough  cefdinir (OMNICEF) 300 MG capsule   Will treat with omnicef for bronchitis. See patient instructions for more details.    Knee swelling: declined x-rays today.  She does not have any pain- just swelling.  She will try a knee sleeve  Abdominal tenderness- she has not noted any pain prior to exam.  Normal wbc count is reassuring.   If she develops any pain or other symptoms she will let me know right away.    Abbe Amsterdam, MD

## 2012-10-20 NOTE — Patient Instructions (Addendum)
Please let me know if you are not better in the next few days- Sooner if worse.    

## 2014-03-22 ENCOUNTER — Inpatient Hospital Stay (HOSPITAL_COMMUNITY): Admission: AD | Admit: 2014-03-22 | Payer: 59 | Source: Ambulatory Visit | Admitting: Obstetrics and Gynecology

## 2014-09-10 LAB — OB RESULTS CONSOLE ABO/RH: RH TYPE: POSITIVE

## 2014-09-10 LAB — OB RESULTS CONSOLE RPR: RPR: NONREACTIVE

## 2014-09-10 LAB — OB RESULTS CONSOLE HEPATITIS B SURFACE ANTIGEN: HEP B S AG: NEGATIVE

## 2014-09-10 LAB — OB RESULTS CONSOLE RUBELLA ANTIBODY, IGM: Rubella: IMMUNE

## 2014-09-10 LAB — OB RESULTS CONSOLE HIV ANTIBODY (ROUTINE TESTING): HIV: NONREACTIVE

## 2014-09-10 LAB — OB RESULTS CONSOLE ANTIBODY SCREEN: ANTIBODY SCREEN: NEGATIVE

## 2014-09-22 LAB — OB RESULTS CONSOLE GC/CHLAMYDIA
CHLAMYDIA, DNA PROBE: NEGATIVE
Gonorrhea: NEGATIVE

## 2014-10-31 NOTE — L&D Delivery Note (Signed)
Delivery Note At 7:16 PM a viable female was delivered via Vaginal, Spontaneous Delivery (Presentation: Left Occiput Anterior).  APGAR: 9, 9; weight  .   Placenta status: Intact, Spontaneous.  Cord: 3 vessels with the following complications: None.  Cord pH: not sent  Anesthesia: Epidural Local  Episiotomy:  none Lacerations:  bilat vag lac + second deg perineal lac Suture Repair: 3.0 vicryl rapide Est. Blood Loss (mL):  400  Mom to postpartum.  Baby to Couplet care / Skin to Skin.  [name] 04/24/2015, 7:46 PM

## 2015-01-28 ENCOUNTER — Other Ambulatory Visit: Payer: Self-pay | Admitting: Physician Assistant

## 2015-01-28 DIAGNOSIS — R1011 Right upper quadrant pain: Secondary | ICD-10-CM

## 2015-02-06 ENCOUNTER — Ambulatory Visit
Admission: RE | Admit: 2015-02-06 | Discharge: 2015-02-06 | Disposition: A | Discharge status: Home / Self Care | Payer: Self-pay | Source: Ambulatory Visit | Attending: Physician Assistant | Admitting: Physician Assistant

## 2015-02-06 DIAGNOSIS — R1011 Right upper quadrant pain: Secondary | ICD-10-CM

## 2015-03-27 LAB — OB RESULTS CONSOLE GBS: GBS: NEGATIVE

## 2015-04-17 ENCOUNTER — Encounter (HOSPITAL_COMMUNITY): Payer: Self-pay | Admitting: *Deleted

## 2015-04-17 ENCOUNTER — Inpatient Hospital Stay (HOSPITAL_COMMUNITY)
Admission: AD | Admit: 2015-04-17 | Discharge: 2015-04-17 | Disposition: A | Discharge status: Home / Self Care | Payer: BLUE CROSS/BLUE SHIELD | Source: Ambulatory Visit | Attending: Obstetrics & Gynecology | Admitting: Obstetrics & Gynecology

## 2015-04-17 DIAGNOSIS — Z3A39 39 weeks gestation of pregnancy: Secondary | ICD-10-CM | POA: Diagnosis not present

## 2015-04-17 DIAGNOSIS — R51 Headache: Secondary | ICD-10-CM | POA: Diagnosis not present

## 2015-04-17 DIAGNOSIS — O9989 Other specified diseases and conditions complicating pregnancy, childbirth and the puerperium: Secondary | ICD-10-CM | POA: Diagnosis not present

## 2015-04-17 DIAGNOSIS — R519 Headache, unspecified: Secondary | ICD-10-CM

## 2015-04-17 LAB — URINE MICROSCOPIC-ADD ON

## 2015-04-17 LAB — CBC WITH DIFFERENTIAL/PLATELET
Band Neutrophils: 0 % (ref 0–10)
Basophils Absolute: 0 10*3/uL (ref 0.0–0.1)
Basophils Relative: 0 % (ref 0–1)
Blasts: 0 %
EOS PCT: 0 % (ref 0–5)
Eosinophils Absolute: 0 10*3/uL (ref 0.0–0.7)
HEMATOCRIT: 33.1 % — AB (ref 36.0–46.0)
HEMOGLOBIN: 11.1 g/dL — AB (ref 12.0–15.0)
Lymphocytes Relative: 18 % (ref 12–46)
Lymphs Abs: 2.7 10*3/uL (ref 0.7–4.0)
MCH: 30.1 pg (ref 26.0–34.0)
MCHC: 33.5 g/dL (ref 30.0–36.0)
MCV: 89.7 fL (ref 78.0–100.0)
MONO ABS: 1.1 10*3/uL — AB (ref 0.1–1.0)
MYELOCYTES: 0 %
Metamyelocytes Relative: 0 %
Monocytes Relative: 7 % (ref 3–12)
NEUTROS PCT: 75 % (ref 43–77)
NRBC: 0 /100{WBCs}
Neutro Abs: 11.2 10*3/uL — ABNORMAL HIGH (ref 1.7–7.7)
OTHER: 0 %
PLATELETS: 208 10*3/uL (ref 150–400)
Promyelocytes Absolute: 0 %
RBC: 3.69 MIL/uL — AB (ref 3.87–5.11)
RDW: 13.4 % (ref 11.5–15.5)
WBC: 15 10*3/uL — ABNORMAL HIGH (ref 4.0–10.5)

## 2015-04-17 LAB — URINALYSIS, ROUTINE W REFLEX MICROSCOPIC
BILIRUBIN URINE: NEGATIVE
Glucose, UA: NEGATIVE mg/dL
Ketones, ur: NEGATIVE mg/dL
NITRITE: NEGATIVE
PH: 7 (ref 5.0–8.0)
Protein, ur: NEGATIVE mg/dL
SPECIFIC GRAVITY, URINE: 1.02 (ref 1.005–1.030)
Urobilinogen, UA: 1 mg/dL (ref 0.0–1.0)

## 2015-04-17 LAB — LACTATE DEHYDROGENASE: LDH: 194 U/L — AB (ref 98–192)

## 2015-04-17 LAB — URIC ACID: Uric Acid, Serum: 4.4 mg/dL (ref 2.3–6.6)

## 2015-04-17 LAB — COMPREHENSIVE METABOLIC PANEL
ALT: 13 U/L — AB (ref 14–54)
AST: 19 U/L (ref 15–41)
Albumin: 2.9 g/dL — ABNORMAL LOW (ref 3.5–5.0)
Alkaline Phosphatase: 151 U/L — ABNORMAL HIGH (ref 38–126)
Anion gap: 4 — ABNORMAL LOW (ref 5–15)
BUN: 9 mg/dL (ref 6–20)
CALCIUM: 8.4 mg/dL — AB (ref 8.9–10.3)
CHLORIDE: 105 mmol/L (ref 101–111)
CO2: 25 mmol/L (ref 22–32)
CREATININE: 0.69 mg/dL (ref 0.44–1.00)
GLUCOSE: 86 mg/dL (ref 65–99)
POTASSIUM: 4.2 mmol/L (ref 3.5–5.1)
SODIUM: 134 mmol/L — AB (ref 135–145)
TOTAL PROTEIN: 6.9 g/dL (ref 6.5–8.1)
Total Bilirubin: 0.2 mg/dL — ABNORMAL LOW (ref 0.3–1.2)

## 2015-04-17 LAB — PROTEIN / CREATININE RATIO, URINE
Creatinine, Urine: 175 mg/dL
Protein Creatinine Ratio: 0.16 mg/mg{Cre} — ABNORMAL HIGH (ref 0.00–0.15)
Total Protein, Urine: 28 mg/dL

## 2015-04-17 MED ORDER — BUTALBITAL-APAP-CAFFEINE 50-325-40 MG PO TABS
1.0000 | ORAL_TABLET | Freq: Once | ORAL | Status: AC
  Administered 2015-04-17: 1 via ORAL
  Filled 2015-04-17: qty 1

## 2015-04-17 NOTE — Discharge Instructions (Signed)
Tension Headache °A tension headache is pain, pressure, or aching felt over the front and sides of the head. Tension headaches often come after stress, feeling worried (anxiety), or feeling sad or down for a while (depressed). °HOME CARE °· Only take medicine as told by your doctor. °· Lie down in a dark, quiet room when you have a headache. °· Keep a journal to find out if certain things bring on headaches. For example, write down: °¨ What you eat and drink. °¨ How much sleep you get. °¨ Any change to your diet or medicines. °· Relax by getting a massage or doing other relaxing activities. °· Put ice or heat packs on the head and neck area as told by your doctor. °· Lessen stress. °· Sit up straight. Do not tighten (tense) your muscles. °· Quit smoking if you smoke. °· Lessen how much alcohol you drink. °· Lessen how much caffeine you drink, or stop drinking caffeine. °· Eat and exercise regularly. °· Get enough sleep. °· Avoid using too much pain medicine. °GET HELP RIGHT AWAY IF:  °· Your headache becomes really bad. °· You have a fever. °· You have a stiff neck. °· You have trouble seeing. °· Your muscles are weak, or you lose muscle control. °· You lose your balance or have trouble walking. °· You feel like you will pass out (faint), or you pass out. °· You have really bad symptoms that are different than your first symptoms. °· You have problems with the medicines given to you by your doctor. °· Your medicines do not work. °· Your headache feels different than the other headaches. °· You feel sick to your stomach (nauseous) or throw up (vomit). °MAKE SURE YOU:  °· Understand these instructions. °· Will watch your condition. °· Will get help right away if you are not doing well or get worse. °Document Released: 01/11/2010 Document Revised: 01/09/2012 Document Reviewed: 10/07/2011 °[employer]® Patient Information ©2015 [employer]. This information is not intended to replace advice given to you by your health  care provider. Make sure you discuss any questions you have with your health care provider. ° °

## 2015-04-17 NOTE — MAU Provider Note (Signed)
History     CSN: [id number]  Arrival date and time: 04/17/15 1757   First Provider Initiated Contact with Patient 04/17/15 1827      Chief Complaint  Patient presents with  . Headache  . Dizziness   HPI  [name] is a 39 y.o. G1P0 at [redacted]w[redacted]d who presents to MAU today with complaint of headache and dizziness. The patient states dizziness started this morning. She states headache now is moderate. She also has associated blurred vision. She denies floaters, RUQ pain, bleeding, LOF or complications with the pregnancy. She does not occasional, infrequent contractions.   OB History    Gravida Para Term Preterm AB TAB SAB Ectopic Multiple Living   1               Past Medical History  Diagnosis Date  . Anxiety   . Depression   . Fatigue   . Headache(784.0)   . Endometriosis     Past Surgical History  Procedure Laterality Date  . Laparoscopy      Family History  Problem Relation Age of Onset  . Alzheimer's disease Mother   . Depression Mother   . Depression Father   . Depression Cousin   . Depression Other   . Suicidality Other     History  Substance Use Topics  . Smoking status: Never Smoker   . Smokeless tobacco: Not on file  . Alcohol Use: No    Allergies: No Known Allergies  Prescriptions prior to admission  Medication Sig Dispense Refill Last Dose  . acetaminophen (TYLENOL) 500 MG tablet Take 1,000 mg by mouth every 6 (six) hours as needed for headache.   Past Week at Unknown time  . dexlansoprazole (DEXILANT) 60 MG capsule Take 60 mg by mouth daily.   04/17/2015 at Unknown time  . ranitidine (ZANTAC) 150 MG tablet Take 150 mg by mouth 2 (two) times daily.   04/17/2015 at Unknown time  . Terconazole (TERAZOL 3 VA) Place 1 applicator vaginally at bedtime. 3 day treatment   04/16/2015 at Unknown time    Review of Systems  Constitutional: Negative for fever and malaise/fatigue.  Gastrointestinal: Positive for nausea and abdominal pain. Negative for  vomiting, diarrhea and constipation.  Genitourinary: Negative for dysuria, urgency and frequency.       Neg - vaginal bleeding, discharge, LOF  Neurological: Positive for dizziness and headaches. Negative for loss of consciousness.   Physical Exam   Blood pressure 109/94, pulse 91, temperature 99.2 F (37.3 C), temperature source Oral, resp. rate 18, SpO2 99 %.  Physical Exam  Nursing note and vitals reviewed. Constitutional: She is oriented to person, place, and time. She appears well-developed and well-nourished. No distress.  HENT:  Head: Normocephalic and atraumatic.  Cardiovascular: Normal rate and intact distal pulses.   Respiratory: Effort normal.  GI: Soft. She exhibits no distension and no mass. There is no tenderness. There is no rebound and no guarding.  Musculoskeletal: She exhibits edema (mild non-pitting edema bilateral lower extremities).  Neurological: She is alert and oriented to person, place, and time. She has normal reflexes.  No clonus  Skin: Skin is warm and dry. No erythema.  Psychiatric: She has a normal mood and affect.   Results for orders placed or performed during the hospital encounter of 04/17/15 (from the past 24 hour(s))  Urinalysis, Routine w reflex microscopic (not at [facility])     Status: Abnormal   Collection Time: 04/17/15  6:12 PM  Result Value Ref  Range   Color, Urine YELLOW YELLOW   APPearance CLEAR CLEAR   Specific Gravity, Urine 1.020 1.005 - 1.030   pH 7.0 5.0 - 8.0   Glucose, UA NEGATIVE NEGATIVE mg/dL   Hgb urine dipstick TRACE (A) NEGATIVE   Bilirubin Urine NEGATIVE NEGATIVE   Ketones, ur NEGATIVE NEGATIVE mg/dL   Protein, ur NEGATIVE NEGATIVE mg/dL   Urobilinogen, UA 1.0 0.0 - 1.0 mg/dL   Nitrite NEGATIVE NEGATIVE   Leukocytes, UA LARGE (A) NEGATIVE  Protein / creatinine ratio, urine     Status: Abnormal   Collection Time: 04/17/15  6:12 PM  Result Value Ref Range   Creatinine, Urine 175.00 mg/dL   Total Protein, Urine 28 mg/dL    Protein Creatinine Ratio 0.16 (H) 0.00 - 0.15 mg/mg[Cre]  Urine microscopic-add on     Status: Abnormal   Collection Time: 04/17/15  6:12 PM  Result Value Ref Range   Squamous Epithelial / LPF FEW (A) RARE   WBC, UA 3-6 <3 WBC/hpf   RBC / HPF 0-2 <3 RBC/hpf   Urine-Other FEW YEAST   CBC with Differential/Platelet     Status: Abnormal   Collection Time: 04/17/15  6:57 PM  Result Value Ref Range   WBC 15.0 (H) 4.0 - 10.5 K/uL   RBC 3.69 (L) 3.87 - 5.11 MIL/uL   Hemoglobin 11.1 (L) 12.0 - 15.0 g/dL   HCT 16.1 (L) 09.6 - 04.5 %   MCV 89.7 78.0 - 100.0 fL   MCH 30.1 26.0 - 34.0 pg   MCHC 33.5 30.0 - 36.0 g/dL   RDW 40.9 81.1 - 91.4 %   Platelets 208 150 - 400 K/uL   Neutrophils Relative % 75 43 - 77 %   Lymphocytes Relative 18 12 - 46 %   Monocytes Relative 7 3 - 12 %   Eosinophils Relative 0 0 - 5 %   Basophils Relative 0 0 - 1 %   Band Neutrophils 0 0 - 10 %   Metamyelocytes Relative 0 %   Myelocytes 0 %   Promyelocytes Absolute 0 %   Blasts 0 %   nRBC 0 0 /100 WBC   Other 0 %   Neutro Abs 11.2 (H) 1.7 - 7.7 K/uL   Lymphs Abs 2.7 0.7 - 4.0 K/uL   Monocytes Absolute 1.1 (H) 0.1 - 1.0 K/uL   Eosinophils Absolute 0.0 0.0 - 0.7 K/uL   Basophils Absolute 0.0 0.0 - 0.1 K/uL   Smear Review MORPHOLOGY UNREMARKABLE   Comprehensive metabolic panel     Status: Abnormal   Collection Time: 04/17/15  6:57 PM  Result Value Ref Range   Sodium 134 (L) 135 - 145 mmol/L   Potassium 4.2 3.5 - 5.1 mmol/L   Chloride 105 101 - 111 mmol/L   CO2 25 22 - 32 mmol/L   Glucose, Bld 86 65 - 99 mg/dL   BUN 9 6 - 20 mg/dL   Creatinine, Ser 7.82 0.44 - 1.00 mg/dL   Calcium 8.4 (L) 8.9 - 10.3 mg/dL   Total Protein 6.9 6.5 - 8.1 g/dL   Albumin 2.9 (L) 3.5 - 5.0 g/dL   AST 19 15 - 41 U/L   ALT 13 (L) 14 - 54 U/L   Alkaline Phosphatase 151 (H) 38 - 126 U/L   Total Bilirubin 0.2 (L) 0.3 - 1.2 mg/dL   GFR calc non Af Amer >60 >60 mL/min   GFR calc Af Amer >60 >60 mL/min   Anion gap 4 (L)  5 - 15   Lactate dehydrogenase     Status: Abnormal   Collection Time: 04/17/15  6:57 PM  Result Value Ref Range   LDH 194 (H) 98 - 192 U/L  Uric acid     Status: None   Collection Time: 04/17/15  6:57 PM  Result Value Ref Range   Uric Acid, Serum 4.4 2.3 - 6.6 mg/dL   Fetal Monitoring: Baseline: 120 bpm, moderate variability, + accelerations, no decelerations Contractions: irregular, moderate variability  MAU Course  Procedures None  MDM Discussed with Dr. Langston Masker. Recommends pain medication for headache and Pre-eclampsia labs Fioricet given - patient notes slight improvement in headache Discussed labs and patient condition with Dr. Langston Masker. She recommends offering patient headache cocktail. Otherwise labs are appropriate for discharge at this time Offered patient IV fluids with Decadron, Benadryl and Reglan. She declines at this time and would rather go home to rest.  Assessment and Plan  A: SIUP at [redacted]w[redacted]d Headache  P: Discharge home Labor precautions discussed Pre-eclampsia warning signs discussed Patient advised to try Tylenol PRN for headache Patient advised to follow-up with [facility] as scheduled for routine prenatal care Patient may return to MAU as needed or if her condition were to change or worsen   Marny Lowenstein, PA-C  04/17/2015, 8:13 PM

## 2015-04-17 NOTE — MAU Note (Signed)
Patient presents to MAU with c/o of headache and dizziness since early this am that has gotten worse through out the day; Reports blurry vision early this am but none at present. Denies epigastric pain.

## 2015-04-23 ENCOUNTER — Encounter (HOSPITAL_COMMUNITY): Payer: Self-pay | Admitting: *Deleted

## 2015-04-23 ENCOUNTER — Telehealth (HOSPITAL_COMMUNITY): Payer: Self-pay | Admitting: *Deleted

## 2015-04-23 NOTE — H&P (Deleted)
NAME:  Tracy Li, Tracy Li                ACCOUNT NO.:  [id number]  MEDICAL RECORD NO.:  [id number]  LOCATION:                                FACILITY:  [facility]  PHYSICIAN:  Shaelin Lalley M. Mikiah Demond, M.D.DATE OF BIRTH:  06/07/1976  DATE OF ADMISSION:  04/24/2015 DATE OF DISCHARGE:  04/26/2015                             HISTORY & PHYSICAL   CHIEF COMPLAINT:  Labor induction at term.  HPI:  A 39-year-old, G1, P 0, GBS negative at 40 weeks' gestation, was seen at the ultrasound on 04/21/2015, EFW 7 pounds 8 ounces, BPP 8/8 with cervix closed.  We discussed awaiting to 41 weeks with fetal monitoring versus induction of labor which she would prefer the latter. The protocol was reviewed with her.  Again, GBS was negative.  Her 1- hour GTT was 83.  PAST MEDICAL HISTORY:  Please see the [facility] form for details.  REVIEW OF SYSTEMS:  Significant for history of depression, not currently on medicines.  She also is a Jehovah's Witness and refuses blood products.  PHYSICAL EXAM:  VITAL SIGNS:  Temp 98.2, blood pressure 118/78. HEENT:  Unremarkable. NECK:  Supple without masses. LUNGS:  Clear. CARDIOVASCULAR:  Regular rate and rhythm without murmurs, rubs, gallops noted. BREASTS:  Not examined. ABDOMEN:  Term fundal height.  Fetal heart rate 140, cervix was closed vertex. EXTREMITIES:  Unremarkable. NEUROLOGIC:  Unremarkable.  IMPRESSION:  Term pregnancy.  PLAN:  Two-stage labor induction protocol reviewed with her.  Note, Jehovah's Witness.  She understands and will sign that she will refuse to accept blood products.     Rayelynn Loyal M. Lizette Pazos, M.D.     RMH/MEDQ  D:  04/23/2015  T:  04/23/2015  Job:  [id number] 

## 2015-04-23 NOTE — H&P (Signed)
NAMETRICHIA, HIEB NO.:  [id number]  MEDICAL RECORD NO.:  [id number]  LOCATION:                                FACILITY:  [facility]  PHYSICIAN:  Duke Salvia. Marcelle Overlie, M.D.DATE OF BIRTH:  July 27, 1976  DATE OF ADMISSION:  04/24/2015 DATE OF DISCHARGE:  04/26/2015                             HISTORY & PHYSICAL   CHIEF COMPLAINT:  Labor induction at term.  HPI:  A 39 year old, G1, P 0, GBS negative at 40 weeks' gestation, was seen at the ultrasound on 04/21/2015, EFW 7 pounds 8 ounces, BPP 8/8 with cervix closed.  We discussed awaiting to 41 weeks with fetal monitoring versus induction of labor which she would prefer the latter. The protocol was reviewed with her.  Again, GBS was negative.  Her 1- hour GTT was 83.  PAST MEDICAL HISTORY:  Please see the [facility] form for details.  REVIEW OF SYSTEMS:  Significant for history of depression, not currently on medicines.  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  PHYSICAL EXAM:  VITAL SIGNS:  Temp 98.2, blood pressure 118/78. HEENT:  Unremarkable. NECK:  Supple without masses. LUNGS:  Clear. CARDIOVASCULAR:  Regular rate and rhythm without murmurs, rubs, gallops noted. BREASTS:  Not examined. ABDOMEN:  Term fundal height.  Fetal heart rate 140, cervix was closed vertex. EXTREMITIES:  Unremarkable. NEUROLOGIC:  Unremarkable.  IMPRESSION:  Term pregnancy.  PLAN:  Two-stage labor induction protocol reviewed with her.  Note, Jehovah's Witness.  She understands and will sign that she will refuse to accept blood products.     Evalise Abruzzese M. Marcelle Overlie, M.D.     RMH/MEDQ  D:  04/23/2015  T:  04/23/2015  Job:  [id number]

## 2015-04-23 NOTE — Telephone Encounter (Signed)
Preadmission screen  

## 2015-04-24 ENCOUNTER — Inpatient Hospital Stay (HOSPITAL_COMMUNITY)
Admission: RE | Admit: 2015-04-24 | Discharge: 2015-04-26 | DRG: 775 | Disposition: A | Discharge status: Home / Self Care | Payer: BLUE CROSS/BLUE SHIELD | Source: Ambulatory Visit | Attending: Obstetrics and Gynecology | Admitting: Obstetrics and Gynecology

## 2015-04-24 ENCOUNTER — Encounter (HOSPITAL_COMMUNITY): Payer: Self-pay

## 2015-04-24 ENCOUNTER — Inpatient Hospital Stay (HOSPITAL_COMMUNITY): Payer: BLUE CROSS/BLUE SHIELD | Admitting: Anesthesiology

## 2015-04-24 DIAGNOSIS — O480 Post-term pregnancy: Secondary | ICD-10-CM | POA: Diagnosis present

## 2015-04-24 DIAGNOSIS — Z3A40 40 weeks gestation of pregnancy: Secondary | ICD-10-CM | POA: Diagnosis present

## 2015-04-24 DIAGNOSIS — Z3483 Encounter for supervision of other normal pregnancy, third trimester: Secondary | ICD-10-CM | POA: Diagnosis present

## 2015-04-24 DIAGNOSIS — O09513 Supervision of elderly primigravida, third trimester: Secondary | ICD-10-CM | POA: Diagnosis not present

## 2015-04-24 LAB — CBC
HCT: 31.8 % — ABNORMAL LOW (ref 36.0–46.0)
Hemoglobin: 10.7 g/dL — ABNORMAL LOW (ref 12.0–15.0)
MCH: 30.1 pg (ref 26.0–34.0)
MCHC: 33.6 g/dL (ref 30.0–36.0)
MCV: 89.6 fL (ref 78.0–100.0)
PLATELETS: 206 10*3/uL (ref 150–400)
RBC: 3.55 MIL/uL — ABNORMAL LOW (ref 3.87–5.11)
RDW: 13.5 % (ref 11.5–15.5)
WBC: 15.7 10*3/uL — ABNORMAL HIGH (ref 4.0–10.5)

## 2015-04-24 LAB — TYPE AND SCREEN
ABO/RH(D): O POS
Antibody Screen: NEGATIVE

## 2015-04-24 LAB — RPR: RPR: NONREACTIVE

## 2015-04-24 LAB — ABO/RH: ABO/RH(D): O POS

## 2015-04-24 MED ORDER — OXYCODONE-ACETAMINOPHEN 5-325 MG PO TABS
2.0000 | ORAL_TABLET | ORAL | Status: DC | PRN
  Administered 2015-04-25 (×2): 2 via ORAL
  Filled 2015-04-24 (×3): qty 2

## 2015-04-24 MED ORDER — TETANUS-DIPHTH-ACELL PERTUSSIS 5-2.5-18.5 LF-MCG/0.5 IM SUSP
0.5000 mL | Freq: Once | INTRAMUSCULAR | Status: AC
  Administered 2015-04-25: 0.5 mL via INTRAMUSCULAR
  Filled 2015-04-24: qty 0.5

## 2015-04-24 MED ORDER — ZOLPIDEM TARTRATE 5 MG PO TABS: 5.0000 mg | ORAL_TABLET | Freq: Every evening | ORAL | Status: DC | PRN

## 2015-04-24 MED ORDER — FENTANYL 2.5 MCG/ML BUPIVACAINE 1/10 % EPIDURAL INFUSION (WH - ANES): 14.0000 mL/h | INTRAMUSCULAR | Status: DC | PRN

## 2015-04-24 MED ORDER — IBUPROFEN 800 MG PO TABS
800.0000 mg | ORAL_TABLET | Freq: Three times a day (TID) | ORAL | Status: DC | PRN
  Administered 2015-04-24 – 2015-04-26 (×5): 800 mg via ORAL
  Filled 2015-04-24 (×5): qty 1

## 2015-04-24 MED ORDER — BISACODYL 10 MG RE SUPP
10.0000 mg | Freq: Every day | RECTAL | Status: DC | PRN
  Filled 2015-04-24: qty 1

## 2015-04-24 MED ORDER — ONDANSETRON HCL 4 MG PO TABS: 4.0000 mg | ORAL_TABLET | ORAL | Status: DC | PRN

## 2015-04-24 MED ORDER — LACTATED RINGERS IV SOLN
INTRAVENOUS | Status: DC
  Administered 2015-04-24 (×4): via INTRAVENOUS

## 2015-04-24 MED ORDER — SIMETHICONE 80 MG PO CHEW: 80.0000 mg | CHEWABLE_TABLET | ORAL | Status: DC | PRN

## 2015-04-24 MED ORDER — OXYTOCIN 40 UNITS IN LACTATED RINGERS INFUSION - SIMPLE MED
62.5000 mL/h | INTRAVENOUS | Status: DC
  Administered 2015-04-24: 62.5 mL/h via INTRAVENOUS
  Filled 2015-04-24: qty 1000

## 2015-04-24 MED ORDER — TERBUTALINE SULFATE 1 MG/ML IJ SOLN: 0.2500 mg | Freq: Once | INTRAMUSCULAR | Status: DC | PRN

## 2015-04-24 MED ORDER — FENTANYL CITRATE (PF) 100 MCG/2ML IJ SOLN
50.0000 ug | Freq: Once | INTRAMUSCULAR | Status: AC
Start: 2015-04-24 — End: 2015-04-24
  Administered 2015-04-24: 50 ug via EPIDURAL

## 2015-04-24 MED ORDER — WITCH HAZEL-GLYCERIN EX PADS
1.0000 | MEDICATED_PAD | CUTANEOUS | Status: DC | PRN
Start: 2015-04-24 — End: 2015-04-26
  Administered 2015-04-24: 1 via TOPICAL

## 2015-04-24 MED ORDER — FENTANYL CITRATE (PF) 100 MCG/2ML IJ SOLN
INTRAMUSCULAR | Status: AC
  Filled 2015-04-24: qty 2

## 2015-04-24 MED ORDER — OXYCODONE-ACETAMINOPHEN 5-325 MG PO TABS: 2.0000 | ORAL_TABLET | ORAL | Status: DC | PRN

## 2015-04-24 MED ORDER — BUTORPHANOL TARTRATE 1 MG/ML IJ SOLN
1.0000 mg | Freq: Once | INTRAMUSCULAR | Status: AC
  Administered 2015-04-24: 1 mg via INTRAVENOUS
  Filled 2015-04-24: qty 1

## 2015-04-24 MED ORDER — ONDANSETRON HCL 4 MG/2ML IJ SOLN: 4.0000 mg | INTRAMUSCULAR | Status: DC | PRN

## 2015-04-24 MED ORDER — EPHEDRINE 5 MG/ML INJ: 10.0000 mg | INTRAVENOUS | Status: DC | PRN

## 2015-04-24 MED ORDER — PHENYLEPHRINE 40 MCG/ML (10ML) SYRINGE FOR IV PUSH (FOR BLOOD PRESSURE SUPPORT)
80.0000 ug | PREFILLED_SYRINGE | INTRAVENOUS | Status: DC | PRN
  Filled 2015-04-24: qty 20

## 2015-04-24 MED ORDER — CITRIC ACID-SODIUM CITRATE 334-500 MG/5ML PO SOLN: 30.0000 mL | ORAL | Status: DC | PRN

## 2015-04-24 MED ORDER — OXYTOCIN 40 UNITS IN LACTATED RINGERS INFUSION - SIMPLE MED
1.0000 m[IU]/min | INTRAVENOUS | Status: DC
Start: 2015-04-24 — End: 2015-04-24
  Administered 2015-04-24: 2 m[IU]/min via INTRAVENOUS

## 2015-04-24 MED ORDER — MEASLES, MUMPS & RUBELLA VAC ~~LOC~~ INJ
0.5000 mL | INJECTION | Freq: Once | SUBCUTANEOUS | Status: DC
  Filled 2015-04-24: qty 0.5

## 2015-04-24 MED ORDER — ACETAMINOPHEN 325 MG PO TABS: 650.0000 mg | ORAL_TABLET | ORAL | Status: DC | PRN

## 2015-04-24 MED ORDER — SENNOSIDES-DOCUSATE SODIUM 8.6-50 MG PO TABS
2.0000 | ORAL_TABLET | ORAL | Status: DC
  Administered 2015-04-25: 2 via ORAL
  Filled 2015-04-24 (×2): qty 2

## 2015-04-24 MED ORDER — DIBUCAINE 1 % RE OINT
1.0000 "application " | TOPICAL_OINTMENT | RECTAL | Status: DC | PRN
  Administered 2015-04-24: 1 via RECTAL
  Filled 2015-04-24 (×2): qty 28

## 2015-04-24 MED ORDER — LIDOCAINE HCL (PF) 1 % IJ SOLN
INTRAMUSCULAR | Status: DC | PRN
  Administered 2015-04-24: 10 mL
  Administered 2015-04-24: 5 mL
  Administered 2015-04-24: 10 mL

## 2015-04-24 MED ORDER — PRENATAL MULTIVITAMIN CH
1.0000 | ORAL_TABLET | Freq: Every day | ORAL | Status: DC
  Administered 2015-04-25: 1 via ORAL
  Filled 2015-04-24: qty 1

## 2015-04-24 MED ORDER — FLEET ENEMA 7-19 GM/118ML RE ENEM: 1.0000 | ENEMA | Freq: Every day | RECTAL | Status: DC | PRN

## 2015-04-24 MED ORDER — BENZOCAINE-MENTHOL 20-0.5 % EX AERO
1.0000 "application " | INHALATION_SPRAY | CUTANEOUS | Status: DC | PRN
  Administered 2015-04-24: 1 via TOPICAL
  Filled 2015-04-24 (×2): qty 56

## 2015-04-24 MED ORDER — LANOLIN HYDROUS EX OINT: TOPICAL_OINTMENT | CUTANEOUS | Status: DC | PRN

## 2015-04-24 MED ORDER — MISOPROSTOL 25 MCG QUARTER TABLET
25.0000 ug | ORAL_TABLET | ORAL | Status: DC | PRN
  Administered 2015-04-24: 25 ug via VAGINAL
  Filled 2015-04-24: qty 0.25

## 2015-04-24 MED ORDER — PROMETHAZINE HCL 25 MG/ML IJ SOLN
12.5000 mg | Freq: Once | INTRAMUSCULAR | Status: AC
  Administered 2015-04-24: 12.5 mg via INTRAVENOUS
  Filled 2015-04-24: qty 1

## 2015-04-24 MED ORDER — FENTANYL 2.5 MCG/ML BUPIVACAINE 1/10 % EPIDURAL INFUSION (WH - ANES)
14.0000 mL/h | INTRAMUSCULAR | Status: DC | PRN
  Administered 2015-04-24 (×2): 14 mL/h via EPIDURAL
  Filled 2015-04-24 (×2): qty 125

## 2015-04-24 MED ORDER — OXYTOCIN BOLUS FROM INFUSION: 500.0000 mL | INTRAVENOUS | Status: DC

## 2015-04-24 MED ORDER — DIPHENHYDRAMINE HCL 25 MG PO CAPS: 25.0000 mg | ORAL_CAPSULE | Freq: Four times a day (QID) | ORAL | Status: DC | PRN

## 2015-04-24 MED ORDER — DIPHENHYDRAMINE HCL 50 MG/ML IJ SOLN: 12.5000 mg | INTRAMUSCULAR | Status: DC | PRN

## 2015-04-24 MED ORDER — LACTATED RINGERS IV SOLN
500.0000 mL | INTRAVENOUS | Status: DC | PRN
  Administered 2015-04-24: 500 mL via INTRAVENOUS

## 2015-04-24 MED ORDER — FAMOTIDINE 20 MG PO TABS
20.0000 mg | ORAL_TABLET | Freq: Two times a day (BID) | ORAL | Status: DC
  Administered 2015-04-25 – 2015-04-26 (×2): 20 mg via ORAL
  Filled 2015-04-24 (×2): qty 1

## 2015-04-24 MED ORDER — FENTANYL 2.5 MCG/ML BUPIVACAINE 1/10 % EPIDURAL INFUSION (WH - ANES)
INTRAMUSCULAR | Status: DC | PRN
  Administered 2015-04-24: 14 mL/h via EPIDURAL

## 2015-04-24 MED ORDER — ONDANSETRON HCL 4 MG/2ML IJ SOLN: 4.0000 mg | Freq: Four times a day (QID) | INTRAMUSCULAR | Status: DC | PRN

## 2015-04-24 MED ORDER — OXYTOCIN 40 UNITS IN LACTATED RINGERS INFUSION - SIMPLE MED
1.0000 m[IU]/min | INTRAVENOUS | Status: DC
  Administered 2015-04-24: 1 m[IU]/min via INTRAVENOUS

## 2015-04-24 MED ORDER — OXYCODONE-ACETAMINOPHEN 5-325 MG PO TABS: 1.0000 | ORAL_TABLET | ORAL | Status: DC | PRN

## 2015-04-24 MED ORDER — FLEET ENEMA 7-19 GM/118ML RE ENEM: 1.0000 | ENEMA | RECTAL | Status: DC | PRN

## 2015-04-24 MED ORDER — OXYCODONE-ACETAMINOPHEN 5-325 MG PO TABS
1.0000 | ORAL_TABLET | ORAL | Status: DC | PRN
  Administered 2015-04-24 – 2015-04-26 (×5): 1 via ORAL
  Filled 2015-04-24 (×4): qty 1

## 2015-04-24 MED ORDER — LIDOCAINE HCL (PF) 1 % IJ SOLN
30.0000 mL | INTRAMUSCULAR | Status: DC | PRN
  Administered 2015-04-24: 30 mL via SUBCUTANEOUS
  Filled 2015-04-24: qty 30

## 2015-04-24 NOTE — Anesthesia Preprocedure Evaluation (Signed)
Anesthesia Evaluation  Patient identified by MRN, date of birth, ID band Patient awake and Patient confused    Reviewed: Allergy & Precautions, H&P , NPO status , Patient's Chart, lab work & pertinent test results  Airway Mallampati: II       Dental   Pulmonary  breath sounds clear to auscultation  Pulmonary exam normal       Cardiovascular Exercise Tolerance: Good Normal cardiovascular examRhythm:regular Rate:Normal     Neuro/Psych    GI/Hepatic   Endo/Other    Renal/GU      Musculoskeletal   Abdominal   Peds  Hematology   Anesthesia Other Findings   Reproductive/Obstetrics (+) Pregnancy Post dates                             Anesthesia Physical Anesthesia Plan  ASA: II  Anesthesia Plan: Epidural   Post-op Pain Management:    Induction:   Airway Management Planned:   Additional Equipment:   Intra-op Plan:   Post-operative Plan:   Informed Consent: I have reviewed the patients History and Physical, chart, labs and discussed the procedure including the risks, benefits and alternatives for the proposed anesthesia with the patient or authorized representative who has indicated his/her understanding and acceptance.     Plan Discussed with:   Anesthesia Plan Comments:         Anesthesia Quick Evaluation

## 2015-04-24 NOTE — H&P (Signed)
Tracy Li  DICTATION # [id number] CSN# [id number]   [name], MD 04/24/2015 7:14 AM

## 2015-04-24 NOTE — Anesthesia Procedure Notes (Signed)
Epidural Patient location during procedure: OB Start time: 04/24/2015 7:48 AM End time: 04/24/2015 8:06 AM  Staffing Anesthesiologist: Sebastian Ache  Preanesthetic Checklist Completed: patient identified, site marked, surgical consent, pre-op evaluation, timeout performed, IV checked, risks and benefits discussed and monitors and equipment checked  Epidural Patient position: sitting Prep: site prepped and draped and DuraPrep Patient monitoring: heart rate, continuous pulse ox and blood pressure Approach: midline Location: L2-L3 Injection technique: LOR air  Needle:  Needle type: Tuohy  Needle gauge: 17 G Needle length: 9 cm and 9 Needle insertion depth: 7 cm Catheter type: closed end flexible Catheter size: 19 Gauge Catheter at skin depth: 14 cm Test dose: negative  Assessment Events: blood not aspirated, injection not painful, no injection resistance, negative IV test and no paresthesia  Additional Notes   Patient tolerated the insertion well without complications.Reason for block:procedure for pain

## 2015-04-24 NOTE — Progress Notes (Signed)
Had to get epid redone, now comft, LOA @ +1, stable FHR, will start pushing effort

## 2015-04-25 LAB — CBC
HEMATOCRIT: 28.6 % — AB (ref 36.0–46.0)
HEMOGLOBIN: 9.6 g/dL — AB (ref 12.0–15.0)
MCH: 30 pg (ref 26.0–34.0)
MCHC: 33.6 g/dL (ref 30.0–36.0)
MCV: 89.4 fL (ref 78.0–100.0)
PLATELETS: 171 10*3/uL (ref 150–400)
RBC: 3.2 MIL/uL — ABNORMAL LOW (ref 3.87–5.11)
RDW: 13.4 % (ref 11.5–15.5)
WBC: 27.4 10*3/uL — ABNORMAL HIGH (ref 4.0–10.5)

## 2015-04-25 NOTE — Lactation Note (Signed)
This note was copied from the chart of Tracy Tyger Poitras. Lactation Consultation Note: Mom reports that bay nursed well after delivery but has been sleepy today. Attempted to latch baby in football hold but baby took a few sucks then off to sleep. Reviewed normal newborn behavior th first 24 hours, cluster feeding and feeding cues with mom. Bf brochure given with resources for support after DC. No questions at present. To call for assist prn  Patient Name: Tracy Li [id number] Date: 04/25/2015 Reason for consult: Initial assessment   Maternal Data Formula Feeding for Exclusion: No Has patient been taught Hand Expression?: Yes Does the patient have breastfeeding experience prior to this delivery?: No  Feeding Feeding Type: Breast Fed Length of feed: 2 min  LATCH Score/Interventions Latch: Too sleepy or reluctant, no latch achieved, no sucking elicited. (few sucks)  Audible Swallowing: None  Type of Nipple: Everted at rest and after stimulation  Comfort (Breast/Nipple): Soft / non-tender     Hold (Positioning): Assistance needed to correctly position infant at breast and maintain latch. Intervention(s): Breastfeeding basics reviewed;Support Pillows  LATCH Score: 5  Lactation Tools Discussed/Used     Consult Status Consult Status: Follow-up Date: 04/26/15 Follow-up type: In-patient    Pamelia Hoit 04/25/2015, 1:51 PM

## 2015-04-25 NOTE — Progress Notes (Signed)
CLINICAL SOCIAL WORK MATERNAL/CHILD NOTE  Patient Details  Name: Tracy Li MRN: [id number] Date of Birth: 04/24/2015  Date: 04/25/2015  Clinical Social Worker Initiating Note: Sukhraj Esquivias, LCSWDate/ Time Initiated: 04/25/15/1515   Child's Name: Tracy Li   Legal Guardian:  (Parents Santiana Yetta Barre and Desmond Dike)   Need for Interpreter: None   Date of Referral: 04/24/15   Reason for Referral: Other (Comment)   Referral Source: Central Nursery     Address: [facility]  Phone number:  [phone])   Household Members: Spouse   Natural Supports (not living in the home): Extended Family   Professional Supports:Therapist   Employment:Full-time (Both parents are employed)   Type of Work:     Education:     Social worker   Other Resources:     Cultural/Religious Considerations Which May Impact Care: Jehovah's Witness  Strengths: Ability to meet basic needs , Home prepared for child , Compliance with medical plan    Risk Factors/Current Problems: None   Cognitive State: Alert , Able to Concentrate    Mood/Affect: Bright , Relaxed , Happy    CSW Assessment: Acknowledged order for Social Work consult to assess mother's history of depression and anxiety. Parents are married and have no other dependents. Mother reports hx of anxiety and depression. Informed that she has been off medication for the past 5 years. Informed that she recently started therapy again as a preventative measure, because she knows that she is at risk for PP Depression. She reports no current symptoms of depression or anxiety. No acute social concerns related at this time. Mother informed of social work Surveyor, mining.   CSW Plan/Description:    No current barriers to discharge  Akirra Lacerda J, LCSW 04/25/2015, 4:21 PM

## 2015-04-25 NOTE — Anesthesia Postprocedure Evaluation (Signed)
Anesthesia Post Note  Patient: Tracy Li  Procedure(s) Performed: * No procedures listed *  Anesthesia type: Epidural  Patient location: Mother/Baby  Post pain: Pain level controlled  Post assessment: Post-op Vital signs reviewed  Last Vitals:  Filed Vitals:   04/25/15 0641  BP: 112/62  Pulse: 80  Temp: 36.6 C  Resp: 19    Post vital signs: Reviewed  Level of consciousness:alert  Complications: No apparent anesthesia complications Anesthesia Post Note  Patient: Tracy Li  Procedure(s) Performed: * No procedures listed *  Anesthesia type: Epidural  Patient location: Mother/Baby  Post pain: Pain level controlled  Post assessment: Post-op Vital signs reviewed  Last Vitals:  Filed Vitals:   04/25/15 0641  BP: 112/62  Pulse: 80  Temp: 36.6 C  Resp: 19    Post vital signs: Reviewed  Level of consciousness:alert  Complications: No apparent anesthesia complications

## 2015-04-26 MED ORDER — PRENATAL MULTIVITAMIN CH: 1.0000 | ORAL_TABLET | Freq: Every day | ORAL | Status: AC

## 2015-04-26 MED ORDER — IBUPROFEN 800 MG PO TABS: 800.0000 mg | ORAL_TABLET | Freq: Three times a day (TID) | ORAL | Status: AC | PRN

## 2015-04-26 MED ORDER — OXYCODONE-ACETAMINOPHEN 5-325 MG PO TABS
1.0000 | ORAL_TABLET | ORAL | Status: DC | PRN
Start: 2015-04-26 — End: 2015-08-12

## 2015-04-26 NOTE — Discharge Summary (Signed)
Obstetric Discharge Summary Reason for Admission: induction of labor Prenatal Procedures: none Intrapartum Procedures: spontaneous vaginal delivery Postpartum Procedures: none Complications-Operative and Postpartum: none HEMOGLOBIN  Date Value Ref Range Status  04/25/2015 9.6* 12.0 - 15.0 g/dL Final  24/23/5361 44.3 12.2 - 16.2 g/dL Final   HCT  Date Value Ref Range Status  04/25/2015 28.6* 36.0 - 46.0 % Final   HCT, POC  Date Value Ref Range Status  10/20/2012 46.1 37.7 - 47.9 % Final    Physical Exam:  General: alert Lochia: appropriate Uterine Fundus: firm Incision: healing well DVT Evaluation: No evidence of DVT seen on physical exam.  Discharge Diagnoses: Term Pregnancy-delivered  Discharge Information: Date: 04/26/2015 Activity: pelvic rest Diet: routine Medications: PNV, Ibuprofen and Percocet Condition: stable Instructions: refer to practice specific booklet Discharge to: home Follow-up Information    Follow up with [name], MD. Schedule an appointment as soon as possible for a visit in 6 weeks.   Specialty:  Obstetrics and Gynecology   Contact information:   [address] [place] [address] [phone]       Newborn Data: Live born female  Birth Weight: 6 lb 15.6 oz (3165 g) APGAR: 9, 9  Home with mother.  [name] 04/26/2015, 8:21 AM

## 2015-04-26 NOTE — Lactation Note (Signed)
This note was copied from the chart of Tracy Tametha Lemanski. Lactation Consultation Note Mom states BF going well, baby is starting to cluster feed w/good I&O. Mom states breast are starting to feel a little heavy. Noted some filling, good everted nipples. Encouraged to cont. To document I&O. Discussed supply and demand, engorgement, proper latching to prevent soreness, preventing yeast infection, and reminded of support groups. Mom very pleasant and appears knowledgeable and engaged in BF.  Patient Name: Tracy Li [id number] Date: 04/26/2015 Reason for consult: Follow-up assessment   Maternal Data    Feeding    LATCH Score/Interventions       Type of Nipple: Everted at rest and after stimulation  Comfort (Breast/Nipple): Soft / non-tender           Lactation Tools Discussed/Used     Consult Status Consult Status: Complete    Alakai Macbride G 04/26/2015, 9:37 AM

## 2015-08-12 ENCOUNTER — Ambulatory Visit (INDEPENDENT_AMBULATORY_CARE_PROVIDER_SITE_OTHER): Payer: Self-pay | Admitting: Psychiatry

## 2015-08-12 ENCOUNTER — Encounter (HOSPITAL_COMMUNITY): Payer: Self-pay | Admitting: Psychiatry

## 2015-08-12 VITALS — BP 130/68 | HR 98 | Ht 66.0 in | Wt 230.0 lb

## 2015-08-12 DIAGNOSIS — F331 Major depressive disorder, recurrent, moderate: Secondary | ICD-10-CM

## 2015-08-12 DIAGNOSIS — R45851 Suicidal ideations: Secondary | ICD-10-CM

## 2015-08-12 NOTE — Progress Notes (Signed)
[facility]  Arnetha MassyRayvon Dieppa [id number] 39 y.o.  08/12/2015 10:15 AM  Chief Complaint:  I am very anxious and depressed.  History of Present Illness:  Patient is 39 year old African-American, married, employed female who is self-referred for the management of her psychiatric illness.  Patient has seen this writer 3 years ago for the management of depression.  At that time she was taking Abilify, Pamelor and low dose lithium.  Her last visit was February 2013.  She laid off from the work and could not afford to come back.  Patient told she was able to go back to work in November 2013 at the same company.  In the beginning she was doing fine but for past one year she has noticed increased stress at work.  She admitted having irritability, depression, and anxiety symptoms.  She had a lot of issues at work and due to short staff she feels overwhelmed.  She mentioned in the meantime she got pregnant which was surprise and shocking.  She admitted her symptoms continue to get worse and she was dragged into investigation from a coworker who accuses her for mismanagement and she has to face long and lengthy inquiry investigation.  In June she delivered the baby and at the same time she was told to be on administrative leave because of the outcome of the investigation.  Patient told she is not sure about the details of accusation because even her supervisor did not provide much details.  Patient has requested to transfer out from the Department but she has not heard from them yet.  She endorse due to all the stress she is feeling overwhelmed, having crying spells, irritability, racing thoughts, feeling of hopelessness and worthlessness.  She started to have panic attacks more frequently and having excruciating headaches.  She endorse poor sleep, anhedonia, sometime feeling of hopelessness and worthlessness.  Though she denies any active suicidal thoughts but endorsed passive and  fleeting thoughts but no plan.  Her other stressors are limited social support.  She mentioned her husband provided limited emotional support and sometimes she needs him to make a decision about her future plan but get frustrated because he does not want to be involved in it.  Though patient denies any aggression, violence or any paranoia but endorsed frustration, irritability and social isolation.  She endorsed lack of motivation, decreased energy, fear about her future.  Patient denies any paranoia, hallucination, PTSD symptoms, OCD symptoms.  Patient contract for safety because she mentioned that she will not do anything to harm herself or her baby.  Currently she is seeing a therapist .  Her primary care physician recently started her on Zoloft in August and now she is taking 100 mg daily.  She denies any side effects.  She is breast-feeding and she is very reluctant to take any medication that reason.  Her appetite is okay.  Her vitals are stable.  Patient denies drinking or using any illegal substances.  Suicidal Ideation: Passive and fleeting thoughts but no plan or intent. Plan Formed: No Patient has means to carry out plan: No  Homicidal Ideation: No Plan Formed: No Patient has means to carry out plan: No  Past Psychiatric History/Hospitalization(s): Patient denies any history of psychiatric inpatient treatment but endorsed long history of anxiety, depression, irritability and mood swings.  She recall at least 10-12 times when she tried to kill herself but none of these times she required hospitalization.  Most of the time she has taken  extra pills.  She also endorsed superficially cutting her arm but does not require any stitches.  Patient was seeing in this office a few years ago because of depression.  In the past she had tried Abilify, Lamictal, Pamelor, Paxil, Lexapro. Anxiety: Yes Bipolar Disorder: No Depression: Yes Mania: No Psychosis: No Schizophrenia: No Personality Disorder:  No Hospitalization for psychiatric illness: No History of Electroconvulsive Shock Therapy: No Prior Suicide Attempts: Yes  Medical History; Patient has migraine headaches, endometriosis.  She does not have any primary care physician and she goes to OB/GYN for her primary care needs.  Traumatic brain injury: Patient denies any history of traumatic brain injury.  Family History; Patient endorse multiple family member including siblings has depression.  One of her niece committed suicide a few years ago.  Education and Work History; Patient is high school graduate.  Currently she is working as a Merchandiser, retail at [employer].  Psychosocial History; Patient born and raised in [place].  She is married.  She has a 18-month-old baby girl.  Her parents live in [place] who has chronic health issues.  Her father suffered from dementia.  Patient lives with her husband.  She has limited family and social support.  Legal History; Patient denies any legal issues.  History Of Abuse; Patient denies any history of physical sexual verbal and emotional abuse.  Substance Abuse History; Patient denies any current use of drinking or any illegal substance use.  Review of Systems: Psychiatric: Agitation: No Hallucination: No Depressed Mood: Yes Insomnia: Yes Hypersomnia: No Altered Concentration: No Feels Worthless: Yes Grandiose Ideas: No Belief In Special Powers: No New/Increased Substance Abuse: No Compulsions: No  Neurologic: Headache: Yes Seizure: No Paresthesias: No   Outpatient Encounter Prescriptions as of 08/12/2015  Medication Sig  . ibuprofen (ADVIL,MOTRIN) 800 MG tablet Take 1 tablet (800 mg total) by mouth every 8 (eight) hours as needed for moderate pain.  . Prenatal Vit-Fe Fumarate-FA (PRENATAL MULTIVITAMIN) TABS tablet Take 1 tablet by mouth daily at 12 noon.  . sertraline (ZOLOFT) 50 MG tablet Take 100 mg by mouth.  . [DISCONTINUED] dexlansoprazole  (DEXILANT) 60 MG capsule Take 60 mg by mouth daily.  . [DISCONTINUED] oxyCODONE-acetaminophen (PERCOCET/ROXICET) 5-325 MG per tablet Take 1 tablet by mouth every 4 (four) hours as needed (for pain scale 4-7).   No facility-administered encounter medications on file as of 08/12/2015.    No results found for this or any previous visit (from the past 2160 hour(s)).    Constitutional:  BP 130/68 mmHg  Pulse 98  Ht  (1.676 m)  Wt 230 lb (104.327 kg)  BMI 37.14 kg/m2   Musculoskeletal: Strength & Muscle Tone: within normal limits Gait & Station: normal Patient leans: N/A  Psychiatric Specialty Exam: General Appearance: Casual and Fairly Groomed  Patent attorney::  Fair  Speech:  Slow  Volume:  Decreased  Mood:  Anxious, Depressed and Dysphoric  Affect:  Constricted and Depressed  Thought Process:  Coherent  Orientation:  Full (Time, Place, and Person)  Thought Content:  Rumination  Suicidal Thoughts:  Yes.  without intent/plan  Homicidal Thoughts:  No  Memory:  Immediate;   Fair Recent;   Fair Remote;   Fair  Judgement:  Fair  Insight:  Fair  Psychomotor Activity:  Decreased  Concentration:  Fair  Recall:  Good  Fund of Knowledge:  Fair  Language:  Good  Akathisia:  No  Handed:  Right  AIMS (if indicated):     Assets:  Communication  Skills Desire for Improvement Financial Resources/Insurance Housing  ADL's:  Intact  Cognition:  WNL  Sleep:        New problem, with additional work up planned, Review or order clinical lab tests (1), Decision to obtain old records (1), Review and summation of old records (2), Established Problem, Worsening (2), New Problem, with no additional work-up planned (3) and Review of Medication Regimen & Side Effects (2)  Assessment: Axis I: Major Depressive Disorder, recurrent moderate  Axis II: Deferred  Axis III:  Past Medical History  Diagnosis Date  . Anxiety   . Fatigue   . Headache(784.0)   . Endometriosis   . Hx of  varicella   . AMA (advanced maternal age) primigravida 35+   . Depression     hx severe Depression with SI no current problems, last meds 5 years ago     Plan:  I review her symptoms, history, collateral information, current medication and recent blood work results.  Patient is experiencing increased depression and anxiety symptoms.  At this time she is taking Zoloft 100 mg daily provided by her OB/GYN.  She has no side effects.  Patient is reluctant to take any other medication due to breast-feeding.  She's been off work since June 19.  Currently she is on administrative leave.  She will find out today if her request to transfer out from her current apartment is approved or not.  I suggested and recommended to do intensive outpatient program to improve her coping and social skills.  Patient agreed with the plan.  Discuss safety plan that anytime having active suicidal thoughts or homicidal thoughts and she need to call 911 or go to the local emergency room.  Patient will be seeing psychiatrist, case manager while she is in the program.  I also suggested if she ever decided to augment her medication management that she can see Dr. Ladona Ridgel in intensive outpatient program.  I will see her again once she finished intensive outpatient program.  Locklyn Henriquez T., MD 08/12/2015

## 2016-06-01 IMAGING — US US ABDOMEN COMPLETE
1 series · 14 of 25 positions shown · non-contrast
Comparison: None.

CLINICAL DATA: Right upper quadrant pain.  Pregnancy.

EXAM:
ULTRASOUND ABDOMEN COMPLETE

[Series 1: us abdomen complete · 0.33mm/px · 14 of 72 slices shown]
[im 1/72]
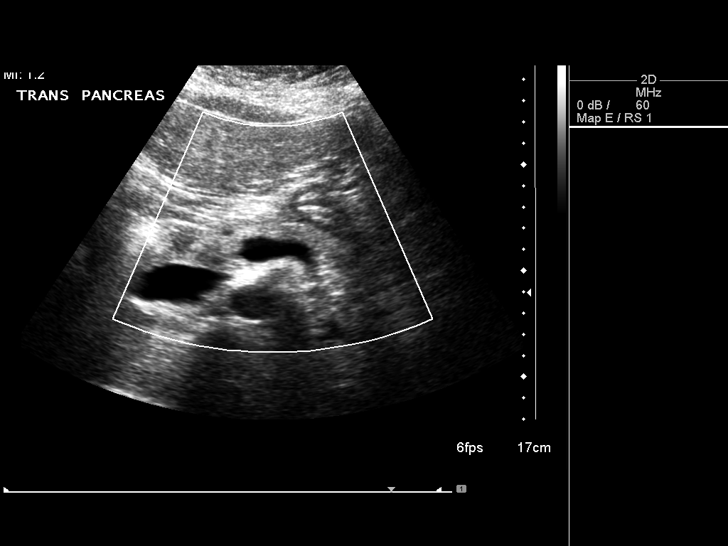
[im 6/72]
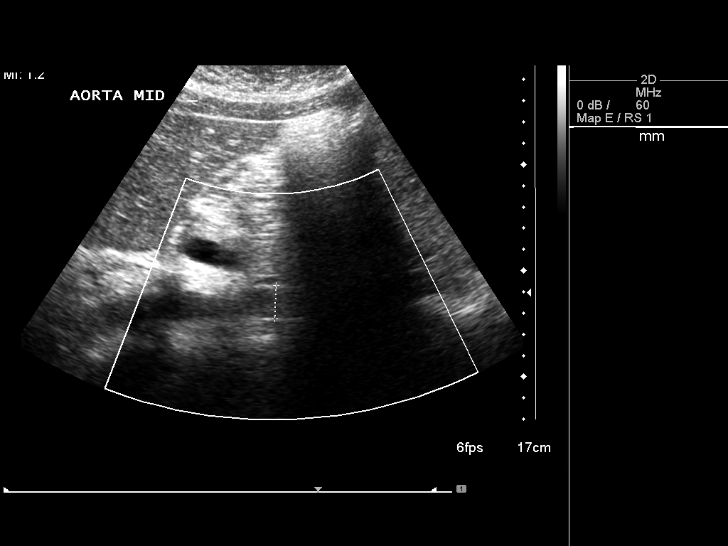
[im 12/72]
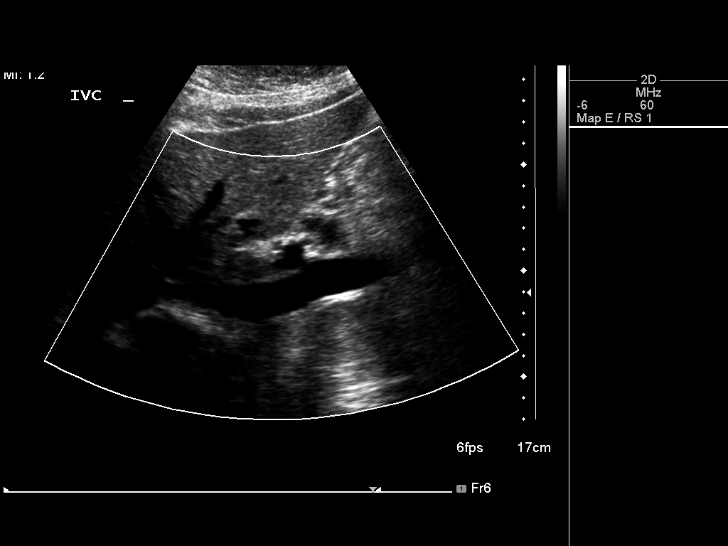
[im 18/72]
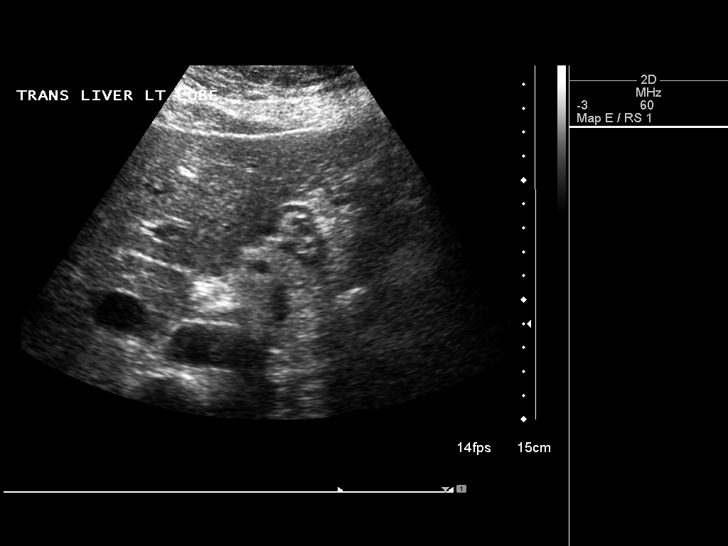
[im 24/72]
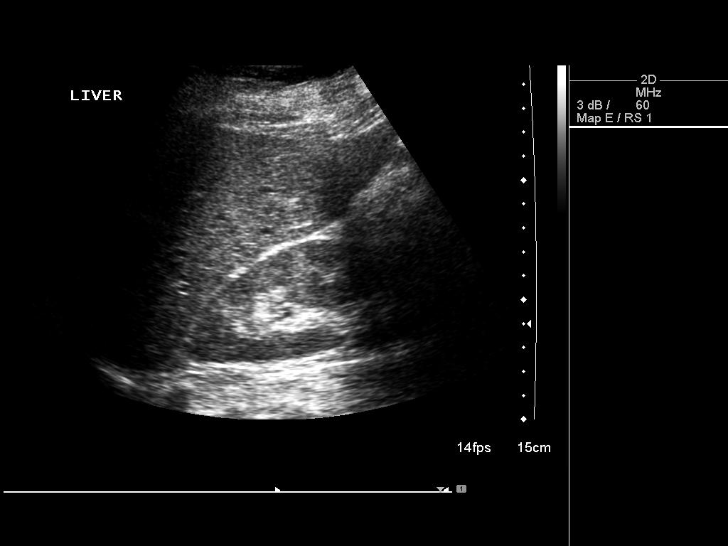
[im 27/72]
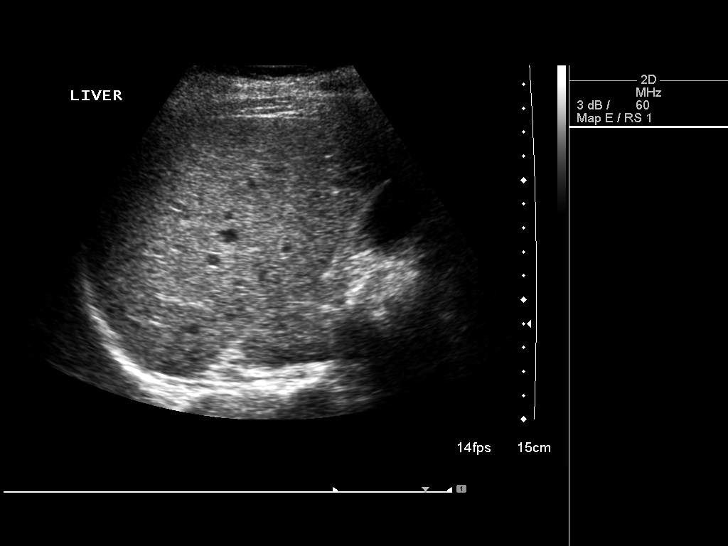
[im 33/72]
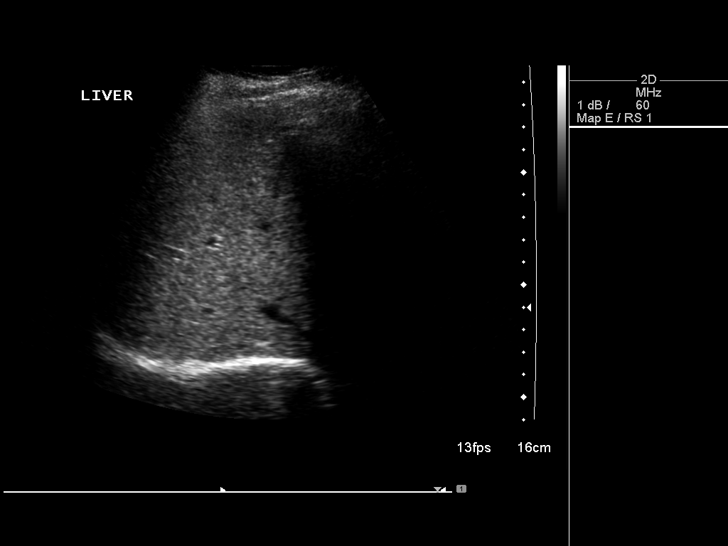
[im 39/72]
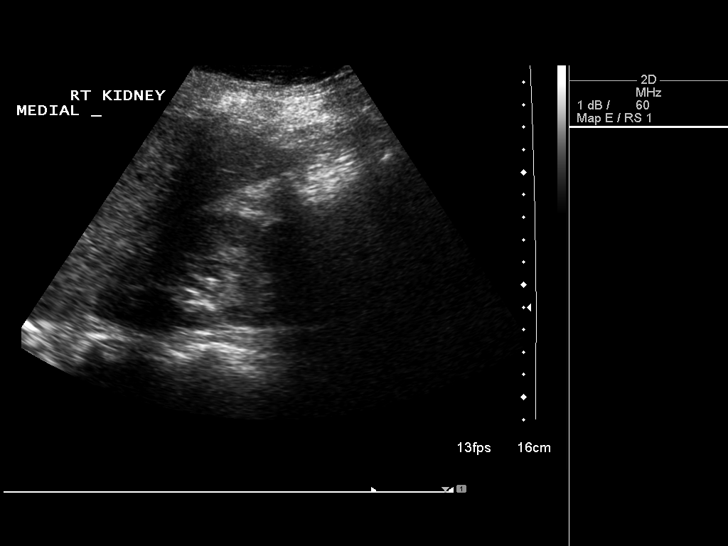
[im 45/72]
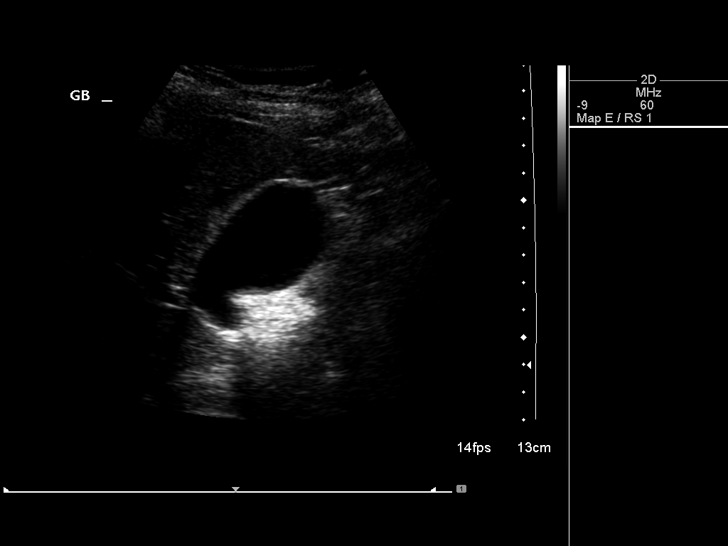
[im 48/72]
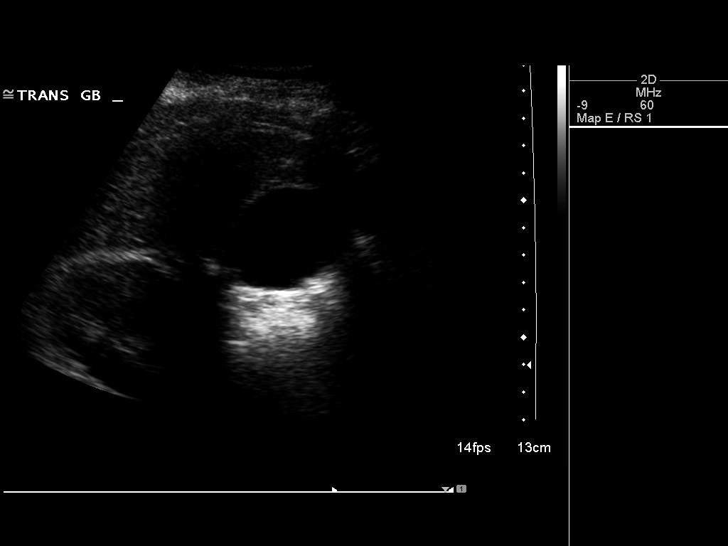
[im 54/72]
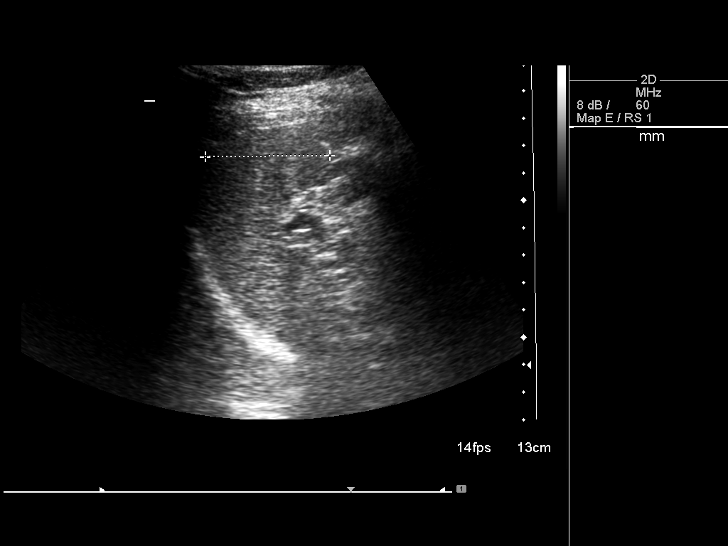
[im 60/72]
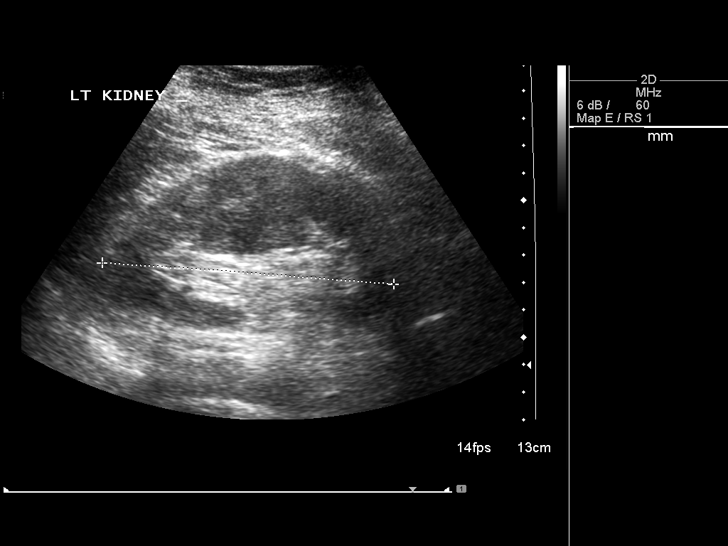
[im 66/72]
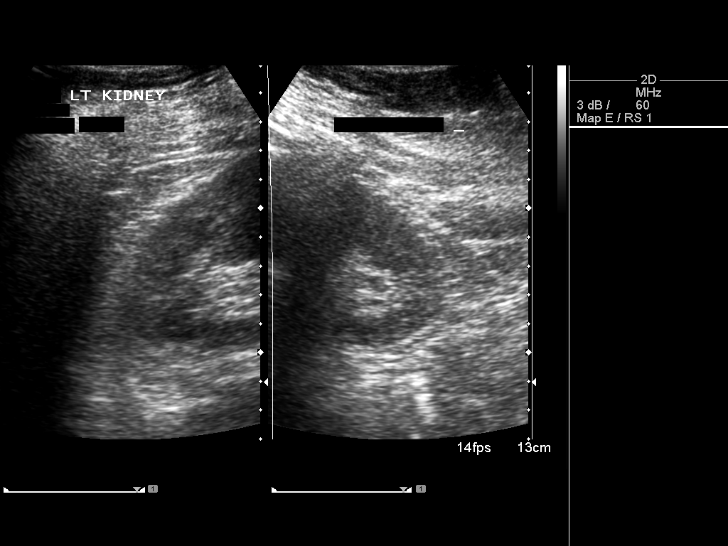
[im 72/72]
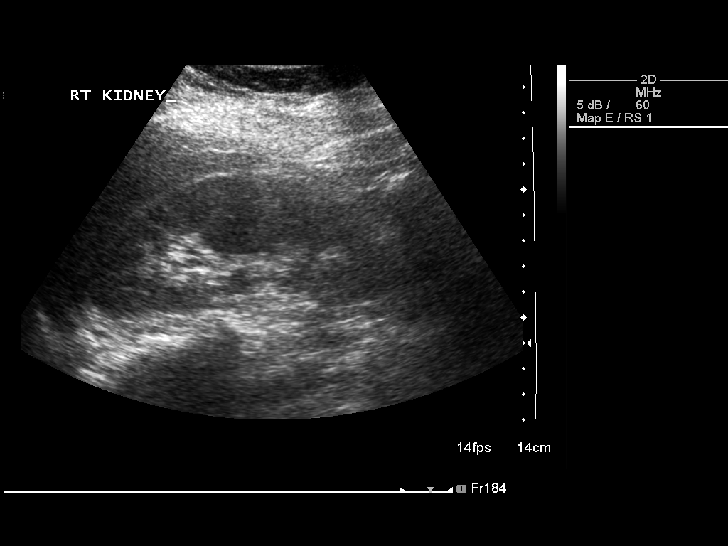

[14 of 25 positions shown; findings below may reference images not displayed]

FINDINGS: Gallbladder: No gallstones or wall thickening visualized. No
sonographic Murphy sign noted.

Common bile duct: Diameter: 2.4 mm

Liver: No focal lesion identified. Within normal limits in
parenchymal echogenicity.

IVC: No abnormality visualized.

Pancreas: Visualized portion unremarkable.

Spleen: Size and appearance within normal limits.

Right Kidney: Length: 11.7 cm. Echogenicity within normal limits. No
mass or hydronephrosis visualized.

Left Kidney: Length: 10.9 cm. Echogenicity within normal limits. No
mass or hydronephrosis visualized.

Abdominal aorta: No aneurysm visualized.

Other findings: None.
IMPRESSION: Normal exam.
